# Patient Record
Sex: Male | Born: 1970 | Race: Black or African American | Hispanic: No | Marital: Married | Smoking: Never smoker
Health system: Southern US, Community
[De-identification: ages and names within clinical notes are randomized; demographics above are authoritative.]

## PROBLEM LIST (undated history)

## (undated) DIAGNOSIS — T7840XA Allergy, unspecified, initial encounter: Secondary | ICD-10-CM

## (undated) DIAGNOSIS — E785 Hyperlipidemia, unspecified: Secondary | ICD-10-CM

## (undated) DIAGNOSIS — I10 Essential (primary) hypertension: Secondary | ICD-10-CM

## (undated) HISTORY — DX: Essential (primary) hypertension: I10

## (undated) HISTORY — DX: Allergy, unspecified, initial encounter: T78.40XA

## (undated) HISTORY — DX: Hyperlipidemia, unspecified: E78.5

---

## 2003-05-24 HISTORY — PX: NASAL SINUS SURGERY: SHX719

## 2004-01-07 ENCOUNTER — Encounter: Admission: RE | Admit: 2004-01-07 | Discharge: 2004-01-07 | Payer: Self-pay | Admitting: Otolaryngology

## 2005-12-17 ENCOUNTER — Emergency Department (HOSPITAL_COMMUNITY): Admission: EM | Admit: 2005-12-17 | Discharge: 2005-12-17 | Payer: Self-pay | Admitting: Emergency Medicine

## 2011-09-13 ENCOUNTER — Ambulatory Visit (INDEPENDENT_AMBULATORY_CARE_PROVIDER_SITE_OTHER): Payer: BC Managed Care – PPO | Admitting: Internal Medicine

## 2011-09-13 VITALS — BP 138/88 | HR 84 | Temp 97.4°F | Resp 16 | Ht 67.5 in | Wt 175.8 lb

## 2011-09-13 DIAGNOSIS — J309 Allergic rhinitis, unspecified: Secondary | ICD-10-CM

## 2011-09-13 DIAGNOSIS — M542 Cervicalgia: Secondary | ICD-10-CM

## 2011-09-13 MED ORDER — FLUTICASONE PROPIONATE 50 MCG/ACT NA SUSP: 2.0000 | Freq: Every day | NASAL | Status: DC

## 2011-09-13 MED ORDER — PREDNISONE 20 MG PO TABS: ORAL_TABLET | ORAL | Status: DC

## 2011-09-13 NOTE — Patient Instructions (Signed)
Take the prednisone taper as prescribed.  To help control your allergy symptoms be sure and take Zyrtec 10 mg daily along with your Flonase nasal spray.  For your neck pain:  Use heat and gentle exercises twice daily.  Take ibuprophen or naproxen OTC as needed for 3 days for the pain.  Allergies, Generic Allergies may happen from anything your body is sensitive to. This may be food, medicines, pollens, chemicals, and nearly anything around you in everyday life that produces allergens. An allergen is anything that causes an allergy producing substance. Heredity is often a factor in causing these problems. This means you may have some of the same allergies as your parents. Food allergies happen in all age groups. Food allergies are some of the most severe and life threatening. Some common food allergies are cow's milk, seafood, eggs, nuts, wheat, and soybeans. SYMPTOMS   Swelling around the mouth.   An itchy red rash or hives.   Vomiting or diarrhea.   Difficulty breathing.  SEVERE ALLERGIC REACTIONS ARE LIFE-THREATENING. This reaction is called anaphylaxis. It can cause the mouth and throat to swell and cause difficulty with breathing and swallowing. In severe reactions only a trace amount of food (for example, peanut oil in a salad) may cause death within seconds. Seasonal allergies occur in all age groups. These are seasonal because they usually occur during the same season every year. They may be a reaction to molds, grass pollens, or tree pollens. Other causes of problems are house dust mite allergens, pet dander, and mold spores. The symptoms often consist of nasal congestion, a runny itchy nose associated with sneezing, and tearing itchy eyes. There is often an associated itching of the mouth and ears. The problems happen when you come in contact with pollens and other allergens. Allergens are the particles in the air that the body reacts to with an allergic reaction. This causes you to  release allergic antibodies. Through a chain of events, these eventually cause you to release histamine into the blood stream. Although it is meant to be protective to the body, it is this release that causes your discomfort. This is why you were given anti-histamines to feel better. If you are unable to pinpoint the offending allergen, it may be determined by skin or blood testing. Allergies cannot be cured but can be controlled with medicine. Hay fever is a collection of all or some of the seasonal allergy problems. It may often be treated with simple over-the-counter medicine such as diphenhydramine. Take medicine as directed. Do not drink alcohol or drive while taking this medicine. Check with your caregiver or package insert for child dosages. If these medicines are not effective, there are many new medicines your caregiver can prescribe. Stronger medicine such as nasal spray, eye drops, and corticosteroids may be used if the first things you try do not work well. Other treatments such as immunotherapy or desensitizing injections can be used if all else fails. Follow up with your caregiver if problems continue. These seasonal allergies are usually not life threatening. They are generally more of a nuisance that can often be handled using medicine. HOME CARE INSTRUCTIONS   If unsure what causes a reaction, keep a diary of foods eaten and symptoms that follow. Avoid foods that cause reactions.   If hives or rash are present:   Take medicine as directed.   You may use an over-the-counter antihistamine (diphenhydramine) for hives and itching as needed.   Apply cold compresses (cloths) to the  skin or take baths in cool water. Avoid hot baths or showers. Heat will make a rash and itching worse.   If you are severely allergic:   Following a treatment for a severe reaction, hospitalization is often required for closer follow-up.   Wear a medic-alert bracelet or necklace stating the allergy.   You  and your family must learn how to give adrenaline or use an anaphylaxis kit.   If you have had a severe reaction, always carry your anaphylaxis kit or EpiPen with you. Use this medicine as directed by your caregiver if a severe reaction is occurring. Failure to do so could have a fatal outcome.  SEEK MEDICAL CARE IF:  You suspect a food allergy. Symptoms generally happen within 30 minutes of eating a food.   Your symptoms have not gone away within 2 days or are getting worse.   You develop new symptoms.   You want to retest yourself or your child with a food or drink you think causes an allergic reaction. Never do this if an anaphylactic reaction to that food or drink has happened before. Only do this under the care of a caregiver.  SEEK IMMEDIATE MEDICAL CARE IF:   You have difficulty breathing, are wheezing, or have a tight feeling in your chest or throat.   You have a swollen mouth, or you have hives, swelling, or itching all over your body.   You have had a severe reaction that has responded to your anaphylaxis kit or an EpiPen. These reactions may return when the medicine has worn off. These reactions should be considered life threatening.  MAKE SURE YOU:   Understand these instructions.   Will watch your condition.   Will get help right away if you are not doing well or get worse.  Document Released: 08/02/2002 Document Revised: 04/28/2011 Document Reviewed: 01/07/2008 [facility] Patient Information 2012 [place], [place].
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
## 2011-10-20 ENCOUNTER — Encounter: Payer: Self-pay | Admitting: Family Medicine

## 2011-10-20 ENCOUNTER — Ambulatory Visit (INDEPENDENT_AMBULATORY_CARE_PROVIDER_SITE_OTHER): Payer: BC Managed Care – PPO | Admitting: Family Medicine

## 2011-10-20 VITALS — BP 142/95 | HR 67 | Temp 99.0°F | Resp 16 | Ht 67.5 in | Wt 168.0 lb

## 2011-10-20 DIAGNOSIS — Z130 Encounter for screening for diseases of the blood and blood-forming organs and certain disorders involving the immune mechanism: Secondary | ICD-10-CM

## 2011-10-20 DIAGNOSIS — Z0000 Encounter for general adult medical examination without abnormal findings: Secondary | ICD-10-CM

## 2011-10-20 DIAGNOSIS — Z13220 Encounter for screening for lipoid disorders: Secondary | ICD-10-CM

## 2011-10-20 DIAGNOSIS — Z23 Encounter for immunization: Secondary | ICD-10-CM

## 2011-10-20 DIAGNOSIS — B353 Tinea pedis: Secondary | ICD-10-CM | POA: Insufficient documentation

## 2011-10-20 LAB — POCT URINALYSIS DIPSTICK
Glucose, UA: NEGATIVE
Nitrite, UA: NEGATIVE
Spec Grav, UA: 1.025
Urobilinogen, UA: 0.2
pH, UA: 7

## 2011-10-20 MED ORDER — CLOTRIMAZOLE-BETAMETHASONE 1-0.05 % EX CREA: TOPICAL_CREAM | Freq: Two times a day (BID) | CUTANEOUS | Status: AC

## 2011-10-20 NOTE — Patient Instructions (Signed)
Testicular Problems and Self-Exam Men can examine themselves easily and effectively with positive results. Monthly exams detect problems early and save lives. There are numerous causes of swelling in the testicle. Testicular cancer usually appears as a firm painless lump in the front part of the testicle. This may feel like a dull ache or heavy feeling located in the lower abdomen (belly), groin, or scrotum.  The risk is greater in men with undescended testicles and it is more common in young men. It is responsible for almost a fifth of cancers in males between ages 22 and 54. Other common causes of swellings, lumps, and testicular pain include injuries, inflammation (soreness) from infection, hydrocele, and torsion. These are a few of the reasons to do monthly self-examination of the testicles. The exam only takes minutes and could add years to your life. Get in the habit! SELF-EXAMINATION OF THE TESTICLES The testicles are easiest to examine after warm baths or showers and are more difficult to examine when you are cold. This is because the muscles attached to the testicles retract and pull them up higher or into the abdomen. While standing, roll one testicle between the thumb and forefinger. Feel for lumps, swelling, or discomfort. A normal testicle is egg shaped and feels firm. It is smooth and not tender. The spermatic cord can be felt as a firm spaghetti-like cord at the back of the testicle. It is also important to examine your groins. This is the crease between the front of your leg and your abdomen. Also, feel for enlarged lymph nodes (glands). Enlarged nodes are also a cause for you to see your caregiver for evaluation.  Self-examination of the testicles and groin areas on a regular basis will help you to know what your own testicles and groins feel like. This will help you pick up an abnormality (difference) at an earlier stage. Early discovery is the key to curing this cancer or treating other  conditions. Any lump, change, or swelling in the testicle calls for immediate evaluation by your caregiver. Cancer of the testicle does not result in impotence and it does not prevent normal intercourse or prevent having children. If your caregiver feels that medical treatment or chemotherapy could lead to infertility, sperm can be frozen for future use. It is necessary to see a caregiver as soon as possible after the discovery of a lump in a testicle. Document Released: 08/15/2000 Document Revised: 04/28/2011 Document Reviewed: 05/10/2008 [employer] Patient Information 2012 ExitCare, Gov [facility]     Athlete's Foot Athlete's foot (tinea pedis) is a fungal infection of the skin on the feet. It often occurs on the skin between the toes or underneath the toes. It can also occur on the soles of the feet. Athlete's foot is more likely to occur in hot, humid weather. Not washing your feet or changing your socks often enough can contribute to athlete's foot. The infection can spread from person to person (contagious). CAUSES Athlete's foot is caused by a fungus. This fungus thrives in warm, moist places. Most people get athlete's foot by sharing shower stalls, towels, and wet floors with an infected person. People with weakened immune systems, including those with diabetes, may be more likely to get athlete's foot. SYMPTOMS   Itchy areas between the toes or on the soles of the feet.   White, flaky, or scaly areas between the toes or on the soles of the feet.   Tiny, intensely itchy blisters between the toes or on the soles of the feet.  Tiny cuts on the skin. These cuts can develop a bacterial infection.   Thick or discolored toenails.  DIAGNOSIS  Your caregiver can usually tell what the problem is by doing a physical exam. Your caregiver may also take a skin sample from the rash area. The skin sample may be examined under a microscope, or it may be tested to see if fungus will grow in the sample. A sample  may also be taken from your toenail for testing. TREATMENT  Over-the-counter and prescription medicines can be used to kill the fungus. These medicines are available as powders or creams. Your caregiver can suggest medicines for you. Fungal infections respond slowly to treatment. You may need to continue using your medicine for several weeks. PREVENTION   Do not share towels.   Wear sandals in wet areas, such as shared locker rooms and shared showers.   Keep your feet dry. Wear shoes that allow air to circulate. Wear cotton or wool socks.  HOME CARE INSTRUCTIONS   Take medicines as directed by your caregiver. Do not use steroid creams on athlete's foot.   Keep your feet clean and cool. Wash your feet daily and dry them thoroughly, especially between your toes.   Change your socks every day. Wear cotton or wool socks. In hot climates, you may need to change your socks 2 to 3 times per day.   Wear sandals or canvas tennis shoes with good air circulation.   If you have blisters, soak your feet in Burow's solution or Epsom salts for 20 to 30 minutes, 2 times a day to dry out the blisters. Make sure you dry your feet thoroughly afterward.  SEEK MEDICAL CARE IF:   You have a fever.   You have swelling, soreness, warmth, or redness in your foot.   You are not getting better after 7 days of treatment.   You are not completely cured after 30 days.   You have any problems caused by your medicines.  MAKE SURE YOU:   Understand these instructions.   Will watch your condition.   Will get help right away if you are not doing well or get worse.  Document Released: 05/06/2000 Document Revised: 04/28/2011 Document Reviewed: 02/25/2011 [facility] Patient Information 2012 [place], [place].Marland KitchenKeeping you healthy  Get these tests  Blood pressure- Have your blood pressure checked once a year by your healthcare provider.  Normal blood pressure is 120/80.  Weight- Have your body mass index (BMI)  calculated to screen for obesity.  BMI is a measure of body fat based on height and weight. You can also calculate your own BMI at https://www.west-esparza.com/.  Cholesterol- Have your cholesterol checked regularly starting at age 32, sooner may be necessary if you have diabetes, high blood pressure, if a family member developed heart diseases at an early age or if you smoke.   Chlamydia, HIV, and other sexual transmitted disease- Get screened each year until the age of 24 then within three months of each new sexual partner.  Diabetes- Have your blood sugar checked regularly if you have high blood pressure, high cholesterol, a family history of diabetes or if you are overweight.  Get these vaccines  Flu shot- Every fall.  Tetanus shot- Every 10 years.  Menactra- Single dose; prevents meningitis.  Take these steps  Don't smoke- If you do smoke, ask your healthcare provider about quitting. For tips on how to quit, go to www.smokefree.gov or call 1-800-QUIT-NOW.  Be physically active- Exercise 5 days a week for  at least 30 minutes.  If you are not already physically active start slow and gradually work up to 30 minutes of moderate physical activity.  Examples of moderate activity include walking briskly, mowing the yard, dancing, swimming bicycling, etc.  Eat a healthy diet- Eat a variety of healthy foods such as fruits, vegetables, low fat milk, low fat cheese, yogurt, lean meats, poultry, fish, beans, tofu, etc.  For more information on healthy eating, go to www.thenutritionsource.org  Drink alcohol in moderation- Limit alcohol intake two drinks or less a day.  Never drink and drive.  Dentist- Brush and floss teeth twice daily; visit your dentis twice a year.  Depression-Your emotional health is as important as your physical health.  If you're feeling down, losing interest in things you normally enjoy please talk with your healthcare provider.  Gun Safety- If you keep a gun in your home,  keep it unloaded and with the safety lock on.  Bullets should be stored separately.  Helmet use- Always wear a helmet when riding a motorcycle, bicycle, rollerblading or skateboarding.  Safe sex- If you may be exposed to a sexually transmitted infection, use a condom  Seat belts- Seat bels can save your life; always wear one.  Smoke/Carbon Monoxide detectors- These detectors need to be installed on the appropriate level of your home.  Replace batteries at least once a year.  Skin Cancer- When out in the sun, cover up and use sunscreen SPF 15 or higher.  Violence- If anyone is threatening or hurting you, please tell your healthcare provider.

## 2011-10-20 NOTE — Progress Notes (Signed)
Subjective:    Patient ID: Dakota Bowman, male    DOB: May 20, 1971, 41 y.o.   MRN: [id number]  HPI  This 41 y.o. [place] male is here for CPE. He has seasonal allergies which responded well  to Fluticasone and Cetirizine. 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 He is married (wife is pregnant) , does nt smoke nor consume alcohol. He exercises regularly.  Immunization status- pt received vaccines prior to coming to [place] but no immunizations in last   10 years except Flu vaccine annually.    Review of Systems  Constitutional: Negative.   HENT: Positive for sinus pressure. Negative for congestion, sore throat, rhinorrhea, sneezing and postnasal drip.   Eyes: Negative.   Respiratory: Negative for cough, chest tightness and shortness of breath.   Cardiovascular: Negative.   Gastrointestinal: Negative.   Genitourinary: Negative.   Musculoskeletal: Negative.   Skin: Negative.   Hematological: Negative.   Psychiatric/Behavioral: Negative.        Objective:   Physical Exam  Vitals reviewed. Constitutional: He is oriented to person, place, and time. He appears well-developed and well-nourished. No distress.  HENT:  Head: Normocephalic and atraumatic.  Right Ear: External ear normal.  Left Ear: External ear normal.  Nose: Nose normal.  Mouth/Throat: Oropharynx is clear and moist.  Eyes: EOM are normal. Pupils are equal, round, and reactive to light. No scleral icterus.       Conj injected  Neck: Normal range of motion. Neck supple. No thyromegaly present.  Cardiovascular: Normal rate, regular rhythm, normal heart sounds and intact distal pulses.  Exam reveals no gallop.   No murmur heard. Pulmonary/Chest: Effort normal and breath sounds normal. No respiratory distress. He has no wheezes.  Abdominal: Soft. Bowel sounds are normal. He exhibits no mass. There is no tenderness. There is no guarding.       No organomegaly   Genitourinary: Penis normal. Right testis shows no mass, no swelling and no tenderness. Right testis is descended. Left testis shows no mass, no swelling and no tenderness. Left testis is descended.  Musculoskeletal: Normal range of motion. He exhibits no edema and no tenderness.  Lymphadenopathy:    He has no cervical adenopathy.       Left: No inguinal adenopathy present.  Neurological: He is alert and oriented to person, place, and time. He has normal reflexes. No cranial nerve deficit. He exhibits normal muscle tone. Coordination normal.  Skin: Skin is warm and dry. Rash noted.       Feet- digits with scaling rash and macerated areas in web spaces (2nd-3rd digits)  Psychiatric: He has a normal mood and affect. His behavior is normal. Judgment and thought content normal.   Results for orders placed in visit on 10/20/11  POCT URINALYSIS DIPSTICK      Component Value Range   Color, UA yellow     Clarity, UA clear     Glucose, UA neg     Bilirubin, UA neg     Ketones, UA neg     Spec Grav, UA 1.025     Blood, UA neg     pH, UA 7.0     Protein, UA neg     Urobilinogen, UA 0.2     Nitrite, UA neg     Leukocytes, UA Negative            Assessment & Plan:   1. Routine general medical examination  at a health care facility  Comprehensive metabolic panel  2. Tinea pedis  RX: Clotrimazole- Betamethasone cream to be applied bid to clean dry skin. Keep skin dry (wear white cotton socks)  3. Screening, anemia, deficiency, iron  CBC with Differential  4. Screening for hyperlipidemia  Lipid panel  5. Need for prophylactic vaccination with combined diphtheria-tetanus-pertussis (DTP) vaccine  Tdap vaccine greater than or equal to 7yo IM

## 2011-10-21 LAB — CBC WITH DIFFERENTIAL/PLATELET
Basophils Absolute: 0 10*3/uL (ref 0.0–0.1)
Basophils Relative: 1 % (ref 0–1)
Eosinophils Absolute: 0.3 10*3/uL (ref 0.0–0.7)
HCT: 40 % (ref 39.0–52.0)
Lymphocytes Relative: 58 % — ABNORMAL HIGH (ref 12–46)
MCH: 26.1 pg (ref 26.0–34.0)
MCV: 76.6 fL — ABNORMAL LOW (ref 78.0–100.0)
Monocytes Absolute: 0.2 10*3/uL (ref 0.1–1.0)
Monocytes Relative: 6 % (ref 3–12)
Neutro Abs: 1.2 10*3/uL — ABNORMAL LOW (ref 1.7–7.7)
Neutrophils Relative %: 28 % — ABNORMAL LOW (ref 43–77)
Platelets: 215 10*3/uL (ref 150–400)
RBC: 5.22 MIL/uL (ref 4.22–5.81)
WBC: 4 10*3/uL (ref 4.0–10.5)

## 2011-10-21 LAB — LIPID PANEL
HDL: 53 mg/dL (ref >39)
VLDL: 14 mg/dL (ref 0–40)

## 2011-10-21 LAB — COMPREHENSIVE METABOLIC PANEL
Albumin: 4.2 g/dL (ref 3.5–5.2)
Alkaline Phosphatase: 50 U/L (ref 39–117)
BUN: 12 mg/dL (ref 6–23)
Calcium: 9.1 mg/dL (ref 8.4–10.5)
Chloride: 105 mEq/L (ref 96–112)

## 2011-10-26 ENCOUNTER — Other Ambulatory Visit: Payer: Self-pay | Admitting: Family Medicine

## 2011-10-26 DIAGNOSIS — R7989 Other specified abnormal findings of blood chemistry: Secondary | ICD-10-CM

## 2011-10-26 NOTE — Progress Notes (Signed)
Quick Note:  Please call pt and advise that the following labs are abnormal...  Blood counts are a little abnormal. Would like to recheck in 8 weeks; improve diet and eat as healthy as possible. Take a multivitamin for men- you can get this without a prescription. Lipids- total cholesterol and LDL('bad") cholesterol are elevated. Better nutrition can improve these numbers. ______

## 2011-10-26 NOTE — Progress Notes (Signed)
Copy sent

## 2011-11-01 ENCOUNTER — Telehealth: Payer: Self-pay

## 2011-11-01 NOTE — Telephone Encounter (Signed)
    Patient calling for lab results 

## 2011-11-02 NOTE — Telephone Encounter (Signed)
Pts questions answered.  

## 2012-09-03 ENCOUNTER — Ambulatory Visit (INDEPENDENT_AMBULATORY_CARE_PROVIDER_SITE_OTHER): Payer: BC Managed Care – PPO | Admitting: Family Medicine

## 2012-09-03 VITALS — BP 132/98 | HR 74 | Temp 97.0°F | Resp 16 | Ht 67.5 in | Wt 180.0 lb

## 2012-09-03 DIAGNOSIS — Z9109 Other allergy status, other than to drugs and biological substances: Secondary | ICD-10-CM

## 2012-09-03 MED ORDER — PREDNISONE 20 MG PO TABS: ORAL_TABLET | ORAL | Status: DC

## 2012-09-03 MED ORDER — FLUTICASONE PROPIONATE 50 MCG/ACT NA SUSP: 2.0000 | Freq: Every day | NASAL | Status: DC

## 2012-09-03 NOTE — Progress Notes (Signed)
42 year old man from [place] with his seasonal allergy flair.  He has been here before for the same problem. He's interested in doing something at presents these symptoms. He started had sinus surgery in the past which did not help. He's been taking Zyrtec without any improvement.  Objective: Marked swelling of the nasal mucosa with pallor. HEENT otherwise unremarkable Chest: Clear  Assessment: Severe seasonal allergies  Plan:Environmental allergies - Plan: predniSONE (DELTASONE) 20 MG tablet, fluticasone (FLONASE) 50 MCG/ACT nasal spray, Ambulatory referral to Allergy

## 2012-11-01 ENCOUNTER — Encounter: Payer: Self-pay | Admitting: Family Medicine

## 2012-11-01 ENCOUNTER — Ambulatory Visit (INDEPENDENT_AMBULATORY_CARE_PROVIDER_SITE_OTHER): Payer: BC Managed Care – PPO | Admitting: Family Medicine

## 2012-11-01 VITALS — BP 135/86 | HR 68 | Temp 98.2°F | Resp 16 | Ht 68.0 in | Wt 182.0 lb

## 2012-11-01 DIAGNOSIS — R21 Rash and other nonspecific skin eruption: Secondary | ICD-10-CM

## 2012-11-01 DIAGNOSIS — Z0000 Encounter for general adult medical examination without abnormal findings: Secondary | ICD-10-CM

## 2012-11-01 LAB — POCT URINALYSIS DIPSTICK
Bilirubin, UA: NEGATIVE
Blood, UA: NEGATIVE
Glucose, UA: NEGATIVE
Ketones, UA: NEGATIVE
Leukocytes, UA: NEGATIVE
Nitrite, UA: NEGATIVE
Protein, UA: NEGATIVE
Spec Grav, UA: 1.025
Urobilinogen, UA: 0.2
pH, UA: 6

## 2012-11-01 LAB — LIPID PANEL
Cholesterol: 213 mg/dL — ABNORMAL HIGH (ref 0–200)
HDL: 55 mg/dL (ref >39)
LDL Cholesterol: 141 mg/dL — ABNORMAL HIGH (ref 0–99)
Total CHOL/HDL Ratio: 3.9 Ratio
Triglycerides: 84 mg/dL (ref <150)
VLDL: 17 mg/dL (ref 0–40)

## 2012-11-01 LAB — CBC
HCT: 39.2 % (ref 39.0–52.0)
Hemoglobin: 13.6 g/dL (ref 13.0–17.0)
MCH: 26.5 pg (ref 26.0–34.0)
MCHC: 34.7 g/dL (ref 30.0–36.0)
MCV: 76.3 fL — ABNORMAL LOW (ref 78.0–100.0)
Platelets: 196 10*3/uL (ref 150–400)
RBC: 5.14 MIL/uL (ref 4.22–5.81)
RDW: 13.5 % (ref 11.5–15.5)
WBC: 4 10*3/uL (ref 4.0–10.5)

## 2012-11-01 LAB — COMPREHENSIVE METABOLIC PANEL
ALT: 25 U/L (ref 0–53)
AST: 25 U/L (ref 0–37)
Albumin: 4.1 g/dL (ref 3.5–5.2)
Alkaline Phosphatase: 56 U/L (ref 39–117)
BUN: 17 mg/dL (ref 6–23)
CO2: 25 mEq/L (ref 19–32)
Calcium: 9.4 mg/dL (ref 8.4–10.5)
Chloride: 104 mEq/L (ref 96–112)
Creat: 1.03 mg/dL (ref 0.50–1.35)
Glucose, Bld: 82 mg/dL (ref 70–99)
Potassium: 3.6 mEq/L (ref 3.5–5.3)
Sodium: 138 mEq/L (ref 135–145)
Total Bilirubin: 0.3 mg/dL (ref 0.3–1.2)
Total Protein: 6.9 g/dL (ref 6.0–8.3)

## 2012-11-01 MED ORDER — TRIAMCINOLONE ACETONIDE 0.1 % EX CREA: TOPICAL_CREAM | Freq: Two times a day (BID) | CUTANEOUS | Status: DC

## 2012-11-01 NOTE — Progress Notes (Signed)
Patient ID: Tawan Degroote MRN: [id number], DOB: [birth year] 42 y.o. Date of Encounter: 11/01/2012, 11:08 AM  Primary Physician: No primary provider on file.  Chief Complaint: Physical (CPE)  HPI: 43 y.o. y/o male from [place] with history noted below here for CPE.  Doing well.  Getting allergy shots near [place].  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  Review of Systems: Consitutional: No fever, chills, fatigue, night sweats, lymphadenopathy, or weight changes. Eyes: No visual changes, eye redness, or discharge. ENT/Mouth: Ears: No otalgia, tinnitus, hearing loss, discharge. Nose: No congestion, rhinorrhea, sinus pain, or epistaxis. Throat: No sore throat, post nasal drip, or teeth pain. Cardiovascular: No CP, palpitations, diaphoresis, DOE, edema, orthopnea, PND. Respiratory: No cough, hemoptysis, SOB, or wheezing. Gastrointestinal: No anorexia, dysphagia, reflux, pain, nausea, vomiting, hematemesis, diarrhea, constipation, BRBPR, or melena. Genitourinary: No dysuria, frequency, urgency, hematuria, incontinence, nocturia, decreased urinary stream, discharge, impotence, or testicular pain/masses. Musculoskeletal: No decreased ROM, myalgias, stiffness, joint swelling, or weakness. Skin: No rash, erythema, lesion changes, pain, warmth, jaundice, or pruritis. Neurological: No headache, dizziness, syncope, seizures, tremors, memory loss, coordination problems, or paresthesias. Psychological: No anxiety, depression, hallucinations, SI/HI. Endocrine: No fatigue, polydipsia, polyphagia, polyuria, or known diabetes. All other systems were reviewed and are otherwise negative.  Past Medical History  Diagnosis Date  . Allergy      Past Surgical History  Procedure Laterality Date  . Nasal sinus surgery  2005    Home Meds:  Prior to Admission medications   Medication Sig Start Date End Date Taking? Authorizing  Provider  cetirizine (ZYRTEC) 10 MG tablet Take 10 mg by mouth daily.   Yes Historical Provider, MD  fluticasone (FLONASE) 50 MCG/ACT nasal spray Place 2 sprays into the nose daily. 09/03/12 09/03/13 Yes Elvina Sidle, MD  predniSONE (DELTASONE) 20 MG tablet 2-2-2-2-2-1-1-1-1-1 avec nouriture 09/03/12   Elvina Sidle, MD    Allergies: No Known Allergies  History   Social History  . Marital Status: Single    Spouse Name: N/A    Number of Children: N/A  . Years of Education: N/A   Occupational History  . Not on file.   Social History Main Topics  . Smoking status: Never Smoker   . Smokeless tobacco: Not on file  . Alcohol Use: No  . Drug Use: No  . Sexually Active: Not on file   Other Topics Concern  . Not on file   Social History Narrative  . No narrative on file    History reviewed. No pertinent family history.  Physical Exam: Blood pressure 135/86, pulse 68, temperature 98.2 F (36.8 C), resp. rate 16, height 5\' 8"  (1.727 m), weight 182 lb (82.555 kg).  General: Well developed, well nourished, in no acute distress. HEENT: Normocephalic, atraumatic. Conjunctiva pink, sclera non-icteric. Pupils 2 mm constricting to 1 mm, round, regular, and equally reactive to light and accomodation. EOMI. Internal auditory canal clear. TMs with good cone of light and without pathology. Nasal mucosa pink. Nares are without discharge. No sinus tenderness. Oral mucosa pink. on  Receding gums . Pharynx without exudate.   Neck: Supple. Trachea midline. No thyromegaly. Full ROM. No lymphadenopathy. Lungs: Clear to auscultation bilaterally without wheezes, rales, or rhonchi. Breathing is of normal effort and unlabored. Cardiovascular: RRR with S1 S2. No murmurs, rubs, or gallops appreciated. Distal pulses 2+ symmetrically. No carotid or abdominal bruits Abdomen: Soft, non-tender, non-distended with normoactive bowel sounds. No hepatosplenomegaly  or masses. No rebound/guarding. No CVA tenderness.  Without hernias.  Genitourinary:  circumcised male. No penile lesions. Testes descended bilaterally, and smooth without tenderness.  Bag of worms palpation left scrotum c/w varicocele Musculoskeletal: Full range of motion and 5/5 strength throughout. Without swelling, atrophy, tenderness, crepitus, or warmth. Extremities without clubbing, cyanosis, or edema. Calves supple. Skin: Warm and moist without erythema, ecchymosis, wounds.  Eczematous changes on arms. Neuro: A+Ox3. CN II-XII grossly intact. Moves all extremities spontaneously. Full sensation throughout. Normal gait. DTR 2+ throughout upper and lower extremities. Finger to nose intact. Psych:  Responds to questions appropriately with a normal affect.   UA:   Assessment/Plan:  42 y.o. y/o  male here for CPE Recommended dentist, continued follow up with allergist Routine general medical examination at a health care facility - Plan: CBC, Comprehensive metabolic panel, Lipid panel, POCT urinalysis dipstick  Rash and nonspecific skin eruption - Plan: triamcinolone cream (KENALOG) 0.1 %   Signed, Elvina Sidle, MD 11/01/2012 11:08 AM

## 2012-11-01 NOTE — Patient Instructions (Signed)
Health Maintenance, Males A healthy lifestyle and preventative care can promote health and wellness.  Maintain regular health, dental, and eye exams.  Eat a healthy diet. Foods like vegetables, fruits, whole grains, low-fat dairy products, and lean protein foods contain the nutrients you need without too many calories. Decrease your intake of foods high in solid fats, added sugars, and salt. Get information about a proper diet from your caregiver, if necessary.  Regular physical exercise is one of the most important things you can do for your health. Most adults should get at least 150 minutes of moderate-intensity exercise (any activity that increases your heart rate and causes you to sweat) each week. In addition, most adults need muscle-strengthening exercises on 2 or more days a week.   Maintain a healthy weight. The body mass index (BMI) is a screening tool to identify possible weight problems. It provides an estimate of body fat based on height and weight. Your caregiver can help determine your BMI, and can help you achieve or maintain a healthy weight. For adults 20 years and older:  A BMI below 18.5 is considered underweight.  A BMI of 18.5 to 24.9 is normal.  A BMI of 25 to 29.9 is considered overweight.  A BMI of 30 and above is considered obese.  Maintain normal blood lipids and cholesterol by exercising and minimizing your intake of saturated fat. Eat a balanced diet with plenty of fruits and vegetables. Blood tests for lipids and cholesterol should begin at age 20 and be repeated every 5 years. If your lipid or cholesterol levels are high, you are over 50, or you are a high risk for heart disease, you may need your cholesterol levels checked more frequently.Ongoing high lipid and cholesterol levels should be treated with medicines, if diet and exercise are not effective.  If you smoke, find out from your caregiver how to quit. If you do not use tobacco, do not start.  If you  choose to drink alcohol, do not exceed 2 drinks per day. One drink is considered to be 12 ounces (355 mL) of beer, 5 ounces (148 mL) of wine, or 1.5 ounces (44 mL) of liquor.  Avoid use of street drugs. Do not share needles with anyone. Ask for help if you need support or instructions about stopping the use of drugs.  High blood pressure causes heart disease and increases the risk of stroke. Blood pressure should be checked at least every 1 to 2 years. Ongoing high blood pressure should be treated with medicines if weight loss and exercise are not effective.  If you are 45 to 43 years old, ask your caregiver if you should take aspirin to prevent heart disease.  Diabetes screening involves taking a blood sample to check your fasting blood sugar level. This should be done once every 3 years, after age 45, if you are within normal weight and without risk factors for diabetes. Testing should be considered at a younger age or be carried out more frequently if you are overweight and have at least 1 risk factor for diabetes.  Colorectal cancer can be detected and often prevented. Most routine colorectal cancer screening begins at the age of 50 and continues through age 75. However, your caregiver may recommend screening at an earlier age if you have risk factors for colon cancer. On a yearly basis, your caregiver may provide home test kits to check for hidden blood in the stool. Use of a small camera at the end of a tube,   to directly examine the colon (sigmoidoscopy or colonoscopy), can detect the earliest forms of colorectal cancer. Talk to your caregiver about this at age 64, when routine screening begins. Direct examination of the colon should be repeated every 5 to 10 years through age 28, unless early forms of pre-cancerous polyps or small growths are found.  Hepatitis C blood testing is recommended for all people born from [birth year] through 1966 and any individual with known risks for hepatitis C.  Healthy  men should no longer receive prostate-specific antigen (PSA) blood tests as part of routine cancer screening. Consult with your caregiver about prostate cancer screening.  Testicular cancer screening is not recommended for adolescents or adult males who have no symptoms. Screening includes self-exam, caregiver exam, and other screening tests. Consult with your caregiver about any symptoms you have or any concerns you have about testicular cancer.  Practice safe sex. Use condoms and avoid high-risk sexual practices to reduce the spread of sexually transmitted infections (STIs).  Use sunscreen with a sun protection factor (SPF) of 30 or greater. Apply sunscreen liberally and repeatedly throughout the day. You should seek shade when your shadow is shorter than you. Protect yourself by wearing long sleeves, pants, a wide-brimmed hat, and sunglasses year round, whenever you are outdoors.  Notify your caregiver of new moles or changes in moles, especially if there is a change in shape or color. Also notify your caregiver if a mole is larger than the size of a pencil eraser.  A one-time screening for abdominal aortic aneurysm (AAA) and surgical repair of large AAAs by sound wave imaging (ultrasonography) is recommended for ages 7 to 20 years who are current or former smokers.  Stay current with your immunizations. Document Released: 11/05/2007 Document Revised: 08/01/2011 Document Reviewed: 10/04/2010 [facility] Patient Information 2014 [place], [place]. Allergies, Generic Allergies may happen from anything your body is sensitive to. This may be food, medicines, pollens, chemicals, and nearly anything around you in everyday life that produces allergens. An allergen is anything that causes an allergy producing substance. Heredity is often a factor in causing these problems. This means you may have some of the same allergies as your parents. Food allergies happen in all age groups. Food allergies are some of  the most severe and life threatening. Some common food allergies are cow's milk, seafood, eggs, nuts, wheat, and soybeans. SYMPTOMS   Swelling around the mouth.  An itchy red rash or hives.  Vomiting or diarrhea.  Difficulty breathing. SEVERE ALLERGIC REACTIONS ARE LIFE-THREATENING. This reaction is called anaphylaxis. It can cause the mouth and throat to swell and cause difficulty with breathing and swallowing. In severe reactions only a trace amount of food (for example, peanut oil in a salad) may cause death within seconds. Seasonal allergies occur in all age groups. These are seasonal because they usually occur during the same season every year. They may be a reaction to molds, grass pollens, or tree pollens. Other causes of problems are house dust mite allergens, pet dander, and mold spores. The symptoms often consist of nasal congestion, a runny itchy nose associated with sneezing, and tearing itchy eyes. There is often an associated itching of the mouth and ears. The problems happen when you come in contact with pollens and other allergens. Allergens are the particles in the air that the body reacts to with an allergic reaction. This causes you to release allergic antibodies. Through a chain of events, these eventually cause you to release histamine into the blood stream.  Although it is meant to be protective to the body, it is this release that causes your discomfort. This is why you were given anti-histamines to feel better. If you are unable to pinpoint the offending allergen, it may be determined by skin or blood testing. Allergies cannot be cured but can be controlled with medicine. Hay fever is a collection of all or some of the seasonal allergy problems. It may often be treated with simple over-the-counter medicine such as diphenhydramine. Take medicine as directed. Do not drink alcohol or drive while taking this medicine. Check with your caregiver or package insert for child dosages. If  these medicines are not effective, there are many new medicines your caregiver can prescribe. Stronger medicine such as nasal spray, eye drops, and corticosteroids may be used if the first things you try do not work well. Other treatments such as immunotherapy or desensitizing injections can be used if all else fails. Follow up with your caregiver if problems continue. These seasonal allergies are usually not life threatening. They are generally more of a nuisance that can often be handled using medicine. HOME CARE INSTRUCTIONS   If unsure what causes a reaction, keep a diary of foods eaten and symptoms that follow. Avoid foods that cause reactions.  If hives or rash are present:  Take medicine as directed.  You may use an over-the-counter antihistamine (diphenhydramine) for hives and itching as needed.  Apply cold compresses (cloths) to the skin or take baths in cool water. Avoid hot baths or showers. Heat will make a rash and itching worse.  If you are severely allergic:  Following a treatment for a severe reaction, hospitalization is often required for closer follow-up.  Wear a medic-alert bracelet or necklace stating the allergy.  You and your family must learn how to give adrenaline or use an anaphylaxis kit.  If you have had a severe reaction, always carry your anaphylaxis kit or EpiPen with you. Use this medicine as directed by your caregiver if a severe reaction is occurring. Failure to do so could have a fatal outcome. SEEK MEDICAL CARE IF:  You suspect a food allergy. Symptoms generally happen within 30 minutes of eating a food.  Your symptoms have not gone away within 2 days or are getting worse.  You develop new symptoms.  You want to retest yourself or your child with a food or drink you think causes an allergic reaction. Never do this if an anaphylactic reaction to that food or drink has happened before. Only do this under the care of a caregiver. SEEK IMMEDIATE MEDICAL  CARE IF:   You have difficulty breathing, are wheezing, or have a tight feeling in your chest or throat.  You have a swollen mouth, or you have hives, swelling, or itching all over your body.  You have had a severe reaction that has responded to your anaphylaxis kit or an EpiPen. These reactions may return when the medicine has worn off. These reactions should be considered life threatening. MAKE SURE YOU:   Understand these instructions.  Will watch your condition.  Will get help right away if you are not doing well or get worse. Document Released: 08/02/2002 Document Revised: 08/01/2011 Document Reviewed: 01/07/2008 [facility] Patient Information 2014 [place], [place].

## 2012-11-02 LAB — HIV ANTIBODY (ROUTINE TESTING W REFLEX): HIV: NONREACTIVE

## 2014-01-23 ENCOUNTER — Encounter: Payer: Self-pay | Admitting: Family Medicine

## 2014-01-23 ENCOUNTER — Telehealth: Payer: Self-pay

## 2014-01-23 ENCOUNTER — Ambulatory Visit (INDEPENDENT_AMBULATORY_CARE_PROVIDER_SITE_OTHER): Payer: 59 | Admitting: Family Medicine

## 2014-01-23 VITALS — BP 139/94 | HR 66 | Temp 97.5°F | Resp 16 | Ht 67.5 in | Wt 177.6 lb

## 2014-01-23 DIAGNOSIS — R5381 Other malaise: Secondary | ICD-10-CM

## 2014-01-23 DIAGNOSIS — Z0000 Encounter for general adult medical examination without abnormal findings: Secondary | ICD-10-CM

## 2014-01-23 DIAGNOSIS — Z13220 Encounter for screening for lipoid disorders: Secondary | ICD-10-CM

## 2014-01-23 DIAGNOSIS — R5383 Other fatigue: Secondary | ICD-10-CM

## 2014-01-23 LAB — CBC WITH DIFFERENTIAL/PLATELET
Basophils Absolute: 0 10*3/uL (ref 0.0–0.1)
Basophils Relative: 1 % (ref 0–1)
Eosinophils Absolute: 0.4 10*3/uL (ref 0.0–0.7)
Eosinophils Relative: 11 % — ABNORMAL HIGH (ref 0–5)
HCT: 39.4 % (ref 39.0–52.0)
Hemoglobin: 13.2 g/dL (ref 13.0–17.0)
Lymphocytes Relative: 55 % — ABNORMAL HIGH (ref 12–46)
Lymphs Abs: 2 10*3/uL (ref 0.7–4.0)
MCH: 26.2 pg (ref 26.0–34.0)
MCHC: 33.5 g/dL (ref 30.0–36.0)
MCV: 78.3 fL (ref 78.0–100.0)
Monocytes Absolute: 0.2 10*3/uL (ref 0.1–1.0)
Monocytes Relative: 6 % (ref 3–12)
Neutro Abs: 1 10*3/uL — ABNORMAL LOW (ref 1.7–7.7)
Neutrophils Relative %: 27 % — ABNORMAL LOW (ref 43–77)
Platelets: 220 10*3/uL (ref 150–400)
RBC: 5.03 MIL/uL (ref 4.22–5.81)
RDW: 13.8 % (ref 11.5–15.5)
WBC: 3.6 10*3/uL — ABNORMAL LOW (ref 4.0–10.5)

## 2014-01-23 LAB — COMPLETE METABOLIC PANEL WITH GFR
ALT: 17 U/L (ref 0–53)
AST: 23 U/L (ref 0–37)
Albumin: 4.5 g/dL (ref 3.5–5.2)
Alkaline Phosphatase: 50 U/L (ref 39–117)
BUN: 19 mg/dL (ref 6–23)
CO2: 29 mEq/L (ref 19–32)
Calcium: 9.3 mg/dL (ref 8.4–10.5)
Chloride: 104 mEq/L (ref 96–112)
Creat: 1.04 mg/dL (ref 0.50–1.35)
GFR, Est African American: >89 mL/min
GFR, Est Non African American: 87 mL/min
Glucose, Bld: 88 mg/dL (ref 70–99)
Potassium: 3.9 mEq/L (ref 3.5–5.3)
Sodium: 139 mEq/L (ref 135–145)
Total Bilirubin: 0.4 mg/dL (ref 0.2–1.2)
Total Protein: 7.3 g/dL (ref 6.0–8.3)

## 2014-01-23 LAB — POCT URINALYSIS DIPSTICK
Bilirubin, UA: NEGATIVE
Blood, UA: NEGATIVE
Glucose, UA: NEGATIVE
Ketones, UA: NEGATIVE
Leukocytes, UA: NEGATIVE
Nitrite, UA: NEGATIVE
Protein, UA: NEGATIVE
Spec Grav, UA: 1.015
Urobilinogen, UA: 0.2
pH, UA: 7.5

## 2014-01-23 LAB — LIPID PANEL
Cholesterol: 224 mg/dL — ABNORMAL HIGH (ref 0–200)
HDL: 62 mg/dL (ref >39)
LDL Cholesterol: 148 mg/dL — ABNORMAL HIGH (ref 0–99)
Total CHOL/HDL Ratio: 3.6 Ratio
Triglycerides: 71 mg/dL (ref <150)
VLDL: 14 mg/dL (ref 0–40)

## 2014-01-23 NOTE — Patient Instructions (Signed)

## 2014-01-23 NOTE — Telephone Encounter (Signed)
PLEASE NOTE: Pt saw Dr Milus Glazier at 104 for CPE on 01/23/14 w/ biometric screening forms. Per Dr L sending originals to his box in lounge at 102 pending lab results to complete. Copy of forms at 104 in Amy L's box.   Once results received, please complete forms and call pt when ready.   bf

## 2014-01-23 NOTE — Progress Notes (Signed)
Patient ID: Roper Tolson MRN: [id number], DOB: [birth year] 43 y.o. Date of Encounter: 01/23/2014, 10:13 AM  Primary Physician: No primary provider on file.  Chief Complaint: Physical (CPE)  HPI: 43 y.o. y/o male with history noted below here for CPE.  Doing well. No issues/complaints.  Married.  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 Eyes: No visual changes, eye redness, or discharge. ENT/Mouth: Ears: No otalgia, tinnitus, hearing loss, discharge. Nose: No congestion, rhinorrhea, sinus pain, or epistaxis. Throat: No sore throat, post nasal drip, or teeth pain. Cardiovascular: No CP, palpitations, diaphoresis, DOE, edema, orthopnea, PND. Respiratory: No cough, hemoptysis, SOB, or wheezing. Gastrointestinal: No anorexia, dysphagia, reflux, pain, nausea, vomiting, hematemesis, diarrhea, constipation, BRBPR, or melena. Genitourinary: No dysuria, frequency, urgency, hematuria, incontinence, nocturia, decreased urinary stream, discharge, impotence, or testicular pain/masses. Musculoskeletal: No decreased ROM, myalgias, stiffness, joint swelling, or weakness. Skin: No rash, erythema, lesion changes, pain, warmth, jaundice, or pruritis. Neurological: No headache, dizziness, syncope, seizures, tremors, memory loss, coordination problems, or paresthesias. Psychological: No anxiety, depression, hallucinations, SI/HI. Endocrine: No fatigue, polydipsia, polyphagia, polyuria, or known diabetes. All other systems were reviewed and are otherwise negative.  Past Medical History  Diagnosis Date  . Allergy      Past Surgical History  Procedure Laterality Date  . Nasal sinus surgery  2005    Home Meds:  Prior to Admission medications   Medication Sig Start Date End Date Taking? Authorizing Provider   cetirizine (ZYRTEC) 10 MG tablet Take 10 mg by mouth daily.    Historical Provider, MD  fluticasone (FLONASE) 50 MCG/ACT nasal spray Place 2 sprays into the nose daily. 09/03/12 09/03/13  Elvina Sidle, MD  triamcinolone cream (KENALOG) 0.1 % Apply topically 2 (two) times daily. 11/01/12   Elvina Sidle, MD    Allergies: No Known Allergies  History   Social History  . Marital Status: Single    Spouse Name: N/A    Number of Children: N/A  . Years of Education: N/A   Occupational History  . Not on file.   Social History Main Topics  . Smoking status: Never Smoker   . Smokeless tobacco: Not on file  . Alcohol Use: No  . Drug Use: No  . Sexual Activity: Not on file   Other Topics Concern  . Not on file   Social History Narrative  . No narrative on file    History reviewed. No pertinent family history.  Physical Exam:  138/90 recheck BP.  Very athletic appearing man Blood pressure 139/94, pulse 66, temperature 97.5 F (36.4 C), temperature source Oral, resp. rate 16, height 5' 7.5" (1.715 m), weight 177 lb 9.6 oz (80.559 kg), SpO2 99.00%.  BP Readings from Last 3 Encounters:  01/23/14 139/94  11/01/12 135/86  09/03/12 132/98   General: Well developed, well nourished, in no acute distress. HEENT: Normocephalic, atraumatic. Conjunctiva pink, sclera non-icteric. Pupils 2 mm constricting to 1 mm, round, regular, and equally reactive to light and accomodation. EOMI. Internal auditory canal clear. TMs with good cone of light and without pathology. Nasal mucosa pink. Nares are without discharge. No sinus tenderness. Oral mucosa pink. Dentition excellent. Pharynx without exudate.     Visual Acuity  Right Eye Distance:   Left Eye Distance:   Bilateral Distance:    Right Eye Near:   Left Eye Near:  Bilateral Near:      Neck: Supple. Trachea midline. No thyromegaly. Full ROM. No lymphadenopathy. Lungs: Clear to auscultation bilaterally without wheezes, rales, or rhonchi.  Breathing is of normal effort and unlabored. Cardiovascular: RRR with S1 S2. No murmurs, rubs, or gallops appreciated. Distal pulses 2+ symmetrically. No carotid or abdominal bruits Abdomen: Soft, non-tender, non-distended with normoactive bowel sounds. No hepatosplenomegaly or masses. No rebound/guarding. No CVA tenderness. Without hernias.   Genitourinary:  circumcised male. No penile lesions. Testes descended bilaterally, and smooth without tenderness or masses.  Musculoskeletal: Full range of motion and 5/5 strength throughout. Without swelling, atrophy, tenderness, crepitus, or warmth. Extremities without clubbing, cyanosis, or edema. Calves supple. Skin: Warm and moist without erythema, ecchymosis, wounds, or rash. Neuro: A+Ox3. CN II-XII grossly intact. Moves all extremities spontaneously. Full sensation throughout. Normal gait. DTR 2+ throughout upper and lower extremities. Finger to nose intact. Psych:  Responds to questions appropriately with a normal affect.   Lab Results  Component Value Date   CHOL 213* 11/01/2012   CHOL 221* 10/20/2011   Lab Results  Component Value Date   HDL 55 11/01/2012   HDL 53 4/54/0981   Lab Results  Component Value Date   LDLCALC 141* 11/01/2012   LDLCALC 154* 10/20/2011   Lab Results  Component Value Date   TRIG 84 11/01/2012   TRIG 69 10/20/2011   Lab Results  Component Value Date   CHOLHDL 3.9 11/01/2012   CHOLHDL 4.2 10/20/2011   No results found for this basename: LDLDIRECT    Assessment/Plan:  43 y.o. y/o healthy male here for CPE Routine general medical examination at a health care facility  Other malaise and fatigue - Plan: CBC with Differential, COMPLETE METABOLIC PANEL WITH GFR, POCT urinalysis dipstick  Screening cholesterol level - Plan: Lipid panel  Signed, Elvina Sidle, MD     Signed, Elvina Sidle, MD 01/23/2014 10:13 AM

## 2014-01-24 NOTE — Telephone Encounter (Signed)
Pt form completed and pt notified. Unable to fax- pt has not filled out their part.

## 2014-10-01 ENCOUNTER — Ambulatory Visit (INDEPENDENT_AMBULATORY_CARE_PROVIDER_SITE_OTHER): Payer: 59 | Admitting: Physician Assistant

## 2014-10-01 VITALS — BP 150/90 | HR 76 | Temp 97.5°F | Resp 20 | Ht 67.5 in | Wt 183.1 lb

## 2014-10-01 DIAGNOSIS — R030 Elevated blood-pressure reading, without diagnosis of hypertension: Secondary | ICD-10-CM | POA: Diagnosis not present

## 2014-10-01 DIAGNOSIS — IMO0001 Reserved for inherently not codable concepts without codable children: Secondary | ICD-10-CM

## 2014-10-01 LAB — POCT URINALYSIS DIPSTICK
Bilirubin, UA: NEGATIVE
Blood, UA: NEGATIVE
GLUCOSE UA: NEGATIVE
Ketones, UA: NEGATIVE
LEUKOCYTES UA: NEGATIVE
NITRITE UA: NEGATIVE
Protein, UA: NEGATIVE
SPEC GRAV UA: 1.02
UROBILINOGEN UA: 0.2
pH, UA: 7

## 2014-10-01 NOTE — Patient Instructions (Addendum)
Start exercise 4 times per week. Check your blood pressure anytime, 2x per week.  If the blood pressure is over 140/90 more than once, I would like you to contact me and we will start you on an anti-hypertensive.    Hypertension Hypertension is another name for high blood pressure. High blood pressure forces your heart to work harder to pump blood. A blood pressure reading has two numbers, which includes a higher number over a lower number (example: 110/72). HOME CARE   Have your blood pressure rechecked by your doctor.  Only take medicine as told by your doctor. Follow the directions carefully. The medicine does not work as well if you skip doses. Skipping doses also puts you at risk for problems.  Do not smoke.  Monitor your blood pressure at home as told by your doctor. GET HELP IF:  You think you are having a reaction to the medicine you are taking.  You have repeat headaches or feel dizzy.  You have puffiness (swelling) in your ankles.  You have trouble with your vision. GET HELP RIGHT AWAY IF:   You get a very bad headache and are confused.  You feel weak, numb, or faint.  You get chest or belly (abdominal) pain.  You throw up (vomit).  You cannot breathe very well. MAKE SURE YOU:   Understand these instructions.  Will watch your condition.  Will get help right away if you are not doing well or get worse. Document Released: 10/26/2007 Document Revised: 05/14/2013 Document Reviewed: 03/01/2013 [facility] Patient Information 2015 [place], [place]. This information is not intended to replace advice given to you by your health care provider. Make sure you discuss any questions you have with your health care provider.

## 2014-10-01 NOTE — Progress Notes (Signed)
[facility] [address], [place] [id number] [phone]  Date:  10/01/2014   Name:  Dakota GarlandSaidou Bickle   DOB:  12/09/1970   MRN:  [id number]  PCP:  No primary care provider on file.    Chief Complaint: Hypertension   History of Present Illness:  Dakota Bowman is a 44 y.o. very pleasant male patient who presents with the following:  Minimal language barrier Patient is here today for elevated blood pressure, found at his dental visit this morning.  This was not rechecked manually.  He was advised for follow up and is here now for this concern.  He has never been told of increased blood pressure.  He has no chest pain, sob, dyspnea, diaphoresis, leg swelling, or vision change.  He has felt palpitations once every few months, which is not associated with dizziness or syncope.  This has occurred for years.  He was at the dentist for back tooth issues, but he currently denies teeth pain.    Patient Active Problem List   Diagnosis Date Noted  . Health care maintenance 10/20/2011  . Tinea pedis 10/20/2011    Past Medical History  Diagnosis Date  . Allergy     Past Surgical History  Procedure Laterality Date  . Nasal sinus surgery  2005    History  Substance Use Topics  . Smoking status: Never Smoker   . Smokeless tobacco: Never Used  . Alcohol Use: No    History reviewed. No pertinent family history.  No Known Allergies  Medication list has been reviewed and updated.  Current Outpatient Prescriptions on File Prior to Visit  Medication Sig Dispense Refill  . cetirizine (ZYRTEC) 10 MG tablet Take 10 mg by mouth daily.    Marland Kitchen. triamcinolone cream (KENALOG) 0.1 % Apply topically 2 (two) times daily. 454 g 1  . fluticasone (FLONASE) 50 MCG/ACT nasal spray Place 2 sprays into the nose daily. 16 g 6   No current facility-administered medications on file prior to visit.    Review of Systems: ROS otherwise unremarkable unless listed above.    Physical  Examination: Filed Vitals:   10/01/14 1040  BP: 118/80  Pulse: 76  Temp: 97.5 F (36.4 C)  Resp: 20   Filed Vitals:   10/01/14 1040  Height: 5' 7.5" (1.715 m)  Weight: 183 lb 2 oz (83.065 kg)   Body mass index is 28.24 kg/(m^2). Ideal Body Weight: Weight in (lb) to have BMI = 25: 161.7    Results for orders placed or performed in visit on 10/01/14  POCT urinalysis dipstick  Result Value Ref Range   Color, UA yellow    Clarity, UA clear    Glucose, UA neg    Bilirubin, UA neg    Ketones, UA neg    Spec Grav, UA 1.020    Blood, UA neg    pH, UA 7.0    Protein, UA neg    Urobilinogen, UA 0.2    Nitrite, UA neg    Leukocytes, UA Negative      Assessment and Plan: 44 year old females is here today for a chief complaint of elevated blood pressure. -Elevated BP seen at the dental office.  Upon check in, blood pressure was wnl.  Blood pressure elevated with my manual check.  I am suspicious that this may be presenting white coat. 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  Advised to monitor blood pressure 2x weekly.  If on two separate occasions,  blood pressure is over 140/90, will contact.  Otherwise will rtc in 4 weeks for BP recheck.    Elevated blood pressure - Plan: POCT urinalysis dipstick  Trena PlattStephanie Vann Okerlund, PA-C [facility] 5/13/201611:59 AM

## 2015-02-09 ENCOUNTER — Encounter: Payer: Self-pay | Admitting: Family Medicine

## 2015-02-09 ENCOUNTER — Ambulatory Visit (INDEPENDENT_AMBULATORY_CARE_PROVIDER_SITE_OTHER): Payer: 59 | Admitting: Family Medicine

## 2015-02-09 VITALS — BP 151/96 | HR 64 | Temp 98.4°F | Resp 16 | Ht 68.0 in | Wt 174.0 lb

## 2015-02-09 DIAGNOSIS — I10 Essential (primary) hypertension: Secondary | ICD-10-CM

## 2015-02-09 DIAGNOSIS — Z0000 Encounter for general adult medical examination without abnormal findings: Secondary | ICD-10-CM

## 2015-02-09 DIAGNOSIS — Z125 Encounter for screening for malignant neoplasm of prostate: Secondary | ICD-10-CM | POA: Diagnosis not present

## 2015-02-09 DIAGNOSIS — E785 Hyperlipidemia, unspecified: Secondary | ICD-10-CM

## 2015-02-09 DIAGNOSIS — M545 Low back pain, unspecified: Secondary | ICD-10-CM

## 2015-02-09 HISTORY — DX: Hyperlipidemia, unspecified: E78.5

## 2015-02-09 HISTORY — DX: Essential (primary) hypertension: I10

## 2015-02-09 LAB — TSH: TSH: 1.091 u[IU]/mL (ref 0.350–4.500)

## 2015-02-09 LAB — POCT URINALYSIS DIPSTICK
BILIRUBIN UA: NEGATIVE
GLUCOSE UA: NEGATIVE
Ketones, UA: NEGATIVE
Leukocytes, UA: NEGATIVE
NITRITE UA: NEGATIVE
PH UA: 7
Protein, UA: NEGATIVE
RBC UA: NEGATIVE
SPEC GRAV UA: 1.02
Urobilinogen, UA: 0.2

## 2015-02-09 LAB — COMPLETE METABOLIC PANEL WITH GFR
ALBUMIN: 4.3 g/dL (ref 3.6–5.1)
ALK PHOS: 48 U/L (ref 40–115)
ALT: 16 U/L (ref 9–46)
AST: 20 U/L (ref 10–40)
BILIRUBIN TOTAL: 0.4 mg/dL (ref 0.2–1.2)
BUN: 17 mg/dL (ref 7–25)
CO2: 30 mmol/L (ref 20–31)
Calcium: 9.3 mg/dL (ref 8.6–10.3)
Chloride: 105 mmol/L (ref 98–110)
Creat: 1.14 mg/dL (ref 0.60–1.35)
GFR, Est African American: 89 mL/min (ref >=60)
GFR, Est Non African American: 77 mL/min (ref >=60)
GLUCOSE: 89 mg/dL (ref 65–99)
POTASSIUM: 4.7 mmol/L (ref 3.5–5.3)
SODIUM: 141 mmol/L (ref 135–146)
TOTAL PROTEIN: 7.3 g/dL (ref 6.1–8.1)

## 2015-02-09 LAB — LIPID PANEL
CHOL/HDL RATIO: 3.5 ratio (ref <=5.0)
Cholesterol: 236 mg/dL — ABNORMAL HIGH (ref 125–200)
HDL: 68 mg/dL (ref >=40)
LDL CALC: 152 mg/dL — AB (ref <130)
Triglycerides: 80 mg/dL (ref <150)
VLDL: 16 mg/dL (ref <30)

## 2015-02-09 MED ORDER — HYDROCHLOROTHIAZIDE 12.5 MG PO CAPS: 12.5000 mg | ORAL_CAPSULE | Freq: Every day | ORAL | Status: DC

## 2015-02-09 NOTE — Patient Instructions (Addendum)
Start hydrochlorothiazide once per day for blood pressure. Keep a record of your blood pressures outside of the office and bring them to the next office visit. Return to the clinic or go to the nearest emergency room if any of your symptoms worsen or new symptoms occur.  For back pain - tylenol, heat or ice to affected area and gentle stretching. If not continuing to improve in next week or worse sooner - return for recheck.   You should receive a call or letter about your lab results within the next week to 10 days.   Hypertension Hypertension, commonly called high blood pressure, is when the force of blood pumping through your arteries is too strong. Your arteries are the blood vessels that carry blood from your heart throughout your body. A blood pressure reading consists of a higher number over a lower number, such as 110/72. The higher number (systolic) is the pressure inside your arteries when your heart pumps. The lower number (diastolic) is the pressure inside your arteries when your heart relaxes. Ideally you want your blood pressure below 120/80. Hypertension forces your heart to work harder to pump blood. Your arteries may become narrow or stiff. Having hypertension puts you at risk for heart disease, stroke, and other problems.  RISK FACTORS Some risk factors for high blood pressure are controllable. Others are not.  Risk factors you cannot control include:  1. Race. You may be at higher risk if you are African American. 2. Age. Risk increases with age. 3. Gender. Men are at higher risk than women before age 64 years. After age 21, women are at higher risk than men. Risk factors you can control include: 1. Not getting enough exercise or physical activity. 2. Being overweight. 3. Getting too much fat, sugar, calories, or salt in your diet. 4. Drinking too much alcohol. SIGNS AND SYMPTOMS Hypertension does not usually cause signs or symptoms. Extremely high blood pressure  (hypertensive crisis) may cause headache, anxiety, shortness of breath, and nosebleed. DIAGNOSIS  To check if you have hypertension, your health care provider will measure your blood pressure while you are seated, with your arm held at the level of your heart. It should be measured at least twice using the same arm. Certain conditions can cause a difference in blood pressure between your right and left arms. A blood pressure reading that is higher than normal on one occasion does not mean that you need treatment. If one blood pressure reading is high, ask your health care provider about having it checked again. TREATMENT  Treating high blood pressure includes making lifestyle changes and possibly taking medicine. Living a healthy lifestyle can help lower high blood pressure. You may need to change some of your habits. Lifestyle changes may include: 1. Following the DASH diet. This diet is high in fruits, vegetables, and whole grains. It is low in salt, red meat, and added sugars. 2. Getting at least 2 hours of brisk physical activity every week. 3. Losing weight if necessary. 4. Not smoking. 5. Limiting alcoholic beverages. 6. Learning ways to reduce stress. If lifestyle changes are not enough to get your blood pressure under control, your health care provider may prescribe medicine. You may need to take more than one. Work closely with your health care provider to understand the risks and benefits. HOME CARE INSTRUCTIONS  Have your blood pressure rechecked as directed by your health care provider.   Take medicines only as directed by your health care provider. Follow the directions  carefully. Blood pressure medicines must be taken as prescribed. The medicine does not work as well when you skip doses. Skipping doses also puts you at risk for problems.   Do not smoke.   Monitor your blood pressure at home as directed by your health care provider. SEEK MEDICAL CARE IF:   You think you are  having a reaction to medicines taken.  You have recurrent headaches or feel dizzy.  You have swelling in your ankles.  You have trouble with your vision. SEEK IMMEDIATE MEDICAL CARE IF:  You develop a severe headache or confusion.  You have unusual weakness, numbness, or feel faint.  You have severe chest or abdominal pain.  You vomit repeatedly.  You have trouble breathing. MAKE SURE YOU:   Understand these instructions.  Will watch your condition.  Will get help right away if you are not doing well or get worse. Document Released: 05/09/2005 Document Revised: 09/23/2013 Document Reviewed: 03/01/2013 [facility] Patient Information 2015 [place], [place]. This information is not intended to replace advice given to you by your health care provider. Make sure you discuss any questions you have with your health care provider.  Back Pain, Adult Low back pain is very common. About 1 in 5 people have back pain.The cause of low back pain is rarely dangerous. The pain often gets better over time.About half of people with a sudden onset of back pain feel better in just 2 weeks. About 8 in 10 people feel better by 6 weeks.  CAUSES Some common causes of back pain include: 4. Strain of the muscles or ligaments supporting the spine. 5. Wear and tear (degeneration) of the spinal discs. 6. Arthritis. 7. Direct injury to the back. DIAGNOSIS Most of the time, the direct cause of low back pain is not known.However, back pain can be treated effectively even when the exact cause of the pain is unknown.Answering your caregiver's questions about your overall health and symptoms is one of the most accurate ways to make sure the cause of your pain is not dangerous. If your caregiver needs more information, he or she may order lab work or imaging tests (X-rays or MRIs).However, even if imaging tests show changes in your back, this usually does not require surgery. HOME CARE INSTRUCTIONS For many  people, back pain returns.Since low back pain is rarely dangerous, it is often a condition that people can learn to [facility] their own.  5. Remain active. It is stressful on the back to sit or stand in one place. Do not sit, drive, or stand in one place for more than 30 minutes at a time. Take short walks on level surfaces as soon as pain allows.Try to increase the length of time you walk each day. 6. Do not stay in bed.Resting more than 1 or 2 days can delay your recovery. 7. Do not avoid exercise or work.Your body is made to move.It is not dangerous to be active, even though your back may hurt.Your back will likely heal faster if you return to being active before your pain is gone. 8. Pay attention to your body when you bend and lift. Many people have less discomfortwhen lifting if they bend their knees, keep the load close to their bodies,and avoid twisting. Often, the most comfortable positions are those that put less stress on your recovering back. 9. Find a comfortable position to sleep. Use a firm mattress and lie on your side with your knees slightly bent. If you lie on your back, put  a pillow under your knees. 10. Only take over-the-counter or prescription medicines as directed by your caregiver. Over-the-counter medicines to reduce pain and inflammation are often the most helpful.Your caregiver may prescribe muscle relaxant drugs.These medicines help dull your pain so you can more quickly return to your normal activities and healthy exercise. 11. Put ice on the injured area. 1. Put ice in a plastic bag. 2. Place a towel between your skin and the bag. 3. Leave the ice on for 15-20 minutes, 03-04 times a day for the first 2 to 3 days. After that, ice and heat may be alternated to reduce pain and spasms. 12. Ask your caregiver about trying back exercises and gentle massage. This may be of some benefit. 13. Avoid feeling anxious or stressed.Stress increases muscle tension and can  worsen back pain.It is important to recognize when you are anxious or stressed and learn ways to manage it.Exercise is a great option. SEEK MEDICAL CARE IF: 7. You have pain that is not relieved with rest or medicine. 8. You have pain that does not improve in 1 week. 9. You have new symptoms. 10. You are generally not feeling well. SEEK IMMEDIATE MEDICAL CARE IF:   You have pain that radiates from your back into your legs.  You develop new bowel or bladder control problems.  You have unusual weakness or numbness in your arms or legs.  You develop nausea or vomiting.  You develop abdominal pain.  You feel faint. Document Released: 05/09/2005 Document Revised: 11/08/2011 Document Reviewed: 09/10/2013 [facility] Patient Information 2015 [place], [place]. This information is not intended to replace advice given to you by your health care provider. Make sure you discuss any questions you have with your health care provider.   Keeping you healthy  Get these tests  Blood pressure- Have your blood pressure checked once a year by your healthcare provider.  Normal blood pressure is 120/80.  Weight- Have your body mass index (BMI) calculated to screen for obesity.  BMI is a measure of body fat based on height and weight. You can also calculate your own BMI at https://www.west-esparza.com/.  Cholesterol- Have your cholesterol checked regularly starting at age 57, sooner may be necessary if you have diabetes, high blood pressure, if a family member developed heart diseases at an early age or if you smoke.   Chlamydia, HIV, and other sexual transmitted disease- Get screened each year until the age of 35 then within three months of each new sexual partner.  Diabetes- Have your blood sugar checked regularly if you have high blood pressure, high cholesterol, a family history of diabetes or if you are overweight.  Get these vaccines  Flu shot- Every fall.  Tetanus shot- Every 10 years.  Menactra-  Single dose; prevents meningitis.  Take these steps  Don't smoke- If you do smoke, ask your healthcare provider about quitting. For tips on how to quit, go to www.smokefree.gov or call 1-800-QUIT-NOW.  Be physically active- Exercise 5 days a week for at least 30 minutes.  If you are not already physically active start slow and gradually work up to 30 minutes of moderate physical activity.  Examples of moderate activity include walking briskly, mowing the yard, dancing, swimming bicycling, etc.  Eat a healthy diet- Eat a variety of healthy foods such as fruits, vegetables, low fat milk, low fat cheese, yogurt, lean meats, poultry, fish, beans, tofu, etc.  For more information on healthy eating, go to www.thenutritionsource.org  Drink alcohol in moderation- Limit alcohol  intake two drinks or less a day.  Never drink and drive.  Dentist- Brush and floss teeth twice daily; visit your dentis twice a year.  Depression-Your emotional health is as important as your physical health.  If you're feeling down, losing interest in things you normally enjoy please talk with your healthcare provider.  Gun Safety- If you keep a gun in your home, keep it unloaded and with the safety lock on.  Bullets should be stored separately.  Helmet use- Always wear a helmet when riding a motorcycle, bicycle, rollerblading or skateboarding.  Safe sex- If you may be exposed to a sexually transmitted infection, use a condom  Seat belts- Seat bels can save your life; always wear one.  Smoke/Carbon Monoxide detectors- These detectors need to be installed on the appropriate level of your home.  Replace batteries at least once a year.  Skin Cancer- When out in the sun, cover up and use sunscreen SPF 15 or higher.  Violence- If anyone is threatening or hurting you, please tell your healthcare provider.

## 2015-02-09 NOTE — Progress Notes (Signed)
Subjective:  This chart was scribed for Meredith Staggers, MD by Stann Ore, Medical Scribe. This patient was seen in room 27 and the patient's care was started 9:40 AM.    Patient ID: Dakota Bowman, male    DOB: 02-04-1971, 44 y.o.   MRN: [id number]  HPI Dakota Bowman is a 44 y.o. male Here for physical exam.  Previously pt to Dr. Milus Glazier, h/o HTN, had elevated BP saw Judeth Cornfield on May 11th, 150/90 at that time, elevated at a dentist office, repeat BP 118/80, thought to be "white coat syndrome". Last physical was sept 2015.   Back Pain R lower back pain last few days. Just some soreness due to lifting. It's getting better. Denies urinary symptoms, incontinence, lower extremity symptoms.   Immunizations Immunization History  Administered Date(s) Administered  . Tdap 10/20/2011   Flu shot was recommended today  High BP 140-150s checked at home. His sister has high BP. Denies stroke or heart attacks in family.   Cancer Screening Agrees to prostate screening today No Fhx of cancer.   Eye He last saw a week ago.   Dentist He last saw a month ago.   Exercise Exercises once or twice a week.    STI testing He is married. Denies any new sexual activity. Declines STI testing.   HLD Lab Results  Component Value Date   CHOL 224* 01/23/2014   HDL 62 01/23/2014   LDLCALC 148* 01/23/2014   TRIG 71 01/23/2014   CHOLHDL 3.6 01/23/2014   Appears to have elevated lipids last year. Not on medications  allergic rhinitis Takes flonase and zyrtec for relief.    Patient Active Problem List   Diagnosis Date Noted  . Health care maintenance 10/20/2011  . Tinea pedis 10/20/2011   Past Medical History  Diagnosis Date  . Allergy    Past Surgical History  Procedure Laterality Date  . Nasal sinus surgery  2005   No Known Allergies Prior to Admission medications   Medication Sig Start Date End Date Taking? Authorizing Illyanna Petillo  cetirizine (ZYRTEC) 10 MG tablet Take 10  mg by mouth daily.    Historical Leola Fiore, MD  fluticasone (FLONASE) 50 MCG/ACT nasal spray Place 2 sprays into the nose daily. 09/03/12 09/03/13  Elvina Sidle, MD  triamcinolone cream (KENALOG) 0.1 % Apply topically 2 (two) times daily. 11/01/12   Elvina Sidle, MD   Social History   Social History  . Marital Status: Single    Spouse Name: N/A  . Number of Children: N/A  . Years of Education: N/A   Occupational History  . Not on file.   Social History Main Topics  . Smoking status: Never Smoker   . Smokeless tobacco: Never Used  . Alcohol Use: No  . Drug Use: No  . Sexual Activity: Yes   Other Topics Concern  . Not on file   Social History Narrative     Review of Systems 13 point ROS on point survey reviewed, no positive responses.     Objective:   Physical Exam  Constitutional: He is oriented to person, place, and time. He appears well-developed and well-nourished.  HENT:  Head: Normocephalic and atraumatic.  Eyes: EOM are normal. Pupils are equal, round, and reactive to light.  Neck: No JVD present. Carotid bruit is not present.  Cardiovascular: Normal rate, regular rhythm and normal heart sounds.   No murmur heard. Pulmonary/Chest: Effort normal and breath sounds normal. He has no rales.  Musculoskeletal: He exhibits no edema.  Full ROM lumbar spine, Minor tenderness on back,  no bony tenderness   Neurological: He is alert and oriented to person, place, and time.  Skin: Skin is warm and dry.  Psychiatric: He has a normal mood and affect.  Vitals reviewed.   Filed Vitals:   02/09/15 0848 02/09/15 0926 02/09/15 0927  BP: 156/98 154/91 151/96  Pulse: 76 63 64  Temp: 98.4 F (36.9 C)    Resp: 16    Height: 5\' 8"  (1.727 m)    Weight: 174 lb (78.926 kg)           Assessment & Plan:   By signing my name below, I, Stann Ore, attest that this documentation has been prepared under the direction and in the presence of Meredith Staggers,  MD. Electronically Signed: Stann Ore, Scribe. 02/09/2015 , 9:40 AM . Dakota Bowman is a 44 y.o. male Annual physical exam  - -anticipatory guidance as below in AVS, screening labs above. Health maintenance items as above in HPI discussed/recommended as applicable.   Essential hypertension - Plan: COMPLETE METABOLIC PANEL WITH GFR, TSH, hydrochlorothiazide (MICROZIDE) 12.5 MG capsule, POCT urinalysis dipstick  -With elevated readings outside of office, and now multiple elevated readings here, will start medication for hypertension. Start HCTZ 12.5 mg daily, side effects discussed in orthostatic precautions. CMP pending. Recheck in 6 weeks.  Right-sided low back pain without sciatica  -Improving, no red flags on exam or history. Continue symptomatic care, stretches, range of motion. RTC precautions if persistent or worsening  Hyperlipidemia - Plan: Lipid panel  -Lipid panel pending.  Screening for prostate cancer - Plan: PSA We discussed pros and cons of prostate cancer screening, and after this discussion, he chose to have screening done. PSA obtained, and no concerning findings on DRE.    Meds ordered this encounter  Medications  . hydrochlorothiazide (MICROZIDE) 12.5 MG capsule    Sig: Take 1 capsule (12.5 mg total) by mouth daily.    Dispense:  30 capsule    Refill:  1   Patient Instructions  Start hydrochlorothiazide once per day for blood pressure. Keep a record of your blood pressures outside of the office and bring them to the next office visit. Return to the clinic or go to the nearest emergency room if any of your symptoms worsen or new symptoms occur.  For back pain - tylenol, heat or ice to affected area and gentle stretching. If not continuing to improve in next week or worse sooner - return for recheck.   You should receive a call or letter about your lab results within the next week to 10 days.   Hypertension Hypertension, commonly called high blood pressure, is  when the force of blood pumping through your arteries is too strong. Your arteries are the blood vessels that carry blood from your heart throughout your body. A blood pressure reading consists of a higher number over a lower number, such as 110/72. The higher number (systolic) is the pressure inside your arteries when your heart pumps. The lower number (diastolic) is the pressure inside your arteries when your heart relaxes. Ideally you want your blood pressure below 120/80. Hypertension forces your heart to work harder to pump blood. Your arteries may become narrow or stiff. Having hypertension puts you at risk for heart disease, stroke, and other problems.  RISK FACTORS Some risk factors for high blood pressure are controllable. Others are not.  Risk factors you cannot control include:  1. Race. You may be at higher  risk if you are African American. 2. Age. Risk increases with age. 3. Gender. Men are at higher risk than women before age 52 years. After age 76, women are at higher risk than men. Risk factors you can control include: 1. Not getting enough exercise or physical activity. 2. Being overweight. 3. Getting too much fat, sugar, calories, or salt in your diet. 4. Drinking too much alcohol. SIGNS AND SYMPTOMS Hypertension does not usually cause signs or symptoms. Extremely high blood pressure (hypertensive crisis) may cause headache, anxiety, shortness of breath, and nosebleed. DIAGNOSIS  To check if you have hypertension, your health care Evelyne Makepeace will measure your blood pressure while you are seated, with your arm held at the level of your heart. It should be measured at least twice using the same arm. Certain conditions can cause a difference in blood pressure between your right and left arms. A blood pressure reading that is higher than normal on one occasion does not mean that you need treatment. If one blood pressure reading is high, ask your health care Marty Uy about having it  checked again. TREATMENT  Treating high blood pressure includes making lifestyle changes and possibly taking medicine. Living a healthy lifestyle can help lower high blood pressure. You may need to change some of your habits. Lifestyle changes may include: 1. Following the DASH diet. This diet is high in fruits, vegetables, and whole grains. It is low in salt, red meat, and added sugars. 2. Getting at least 2 hours of brisk physical activity every week. 3. Losing weight if necessary. 4. Not smoking. 5. Limiting alcoholic beverages. 6. Learning ways to reduce stress. If lifestyle changes are not enough to get your blood pressure under control, your health care Treavon Castilleja may prescribe medicine. You may need to take more than one. Work closely with your health care Mats Jeanlouis to understand the risks and benefits. HOME CARE INSTRUCTIONS  Have your blood pressure rechecked as directed by your health care Dhairya Corales.   Take medicines only as directed by your health care Billye Nydam. Follow the directions carefully. Blood pressure medicines must be taken as prescribed. The medicine does not work as well when you skip doses. Skipping doses also puts you at risk for problems.   Do not smoke.   Monitor your blood pressure at home as directed by your health care Jahmier Willadsen. SEEK MEDICAL CARE IF:   You think you are having a reaction to medicines taken.  You have recurrent headaches or feel dizzy.  You have swelling in your ankles.  You have trouble with your vision. SEEK IMMEDIATE MEDICAL CARE IF:  You develop a severe headache or confusion.  You have unusual weakness, numbness, or feel faint.  You have severe chest or abdominal pain.  You vomit repeatedly.  You have trouble breathing. MAKE SURE YOU:   Understand these instructions.  Will watch your condition.  Will get help right away if you are not doing well or get worse. Document Released: 05/09/2005 Document Revised: 09/23/2013  Document Reviewed: 03/01/2013 [facility] Patient Information 2015 [place], [place]. This information is not intended to replace advice given to you by your health care Zayquan Bogard. Make sure you discuss any questions you have with your health care Lasean Gorniak.  Back Pain, Adult Low back pain is very common. About 1 in 5 people have back pain.The cause of low back pain is rarely dangerous. The pain often gets better over time.About half of people with a sudden onset of back pain feel better in just 2  weeks. About 8 in 10 people feel better by 6 weeks.  CAUSES Some common causes of back pain include: 4. Strain of the muscles or ligaments supporting the spine. 5. Wear and tear (degeneration) of the spinal discs. 6. Arthritis. 7. Direct injury to the back. DIAGNOSIS Most of the time, the direct cause of low back pain is not known.However, back pain can be treated effectively even when the exact cause of the pain is unknown.Answering your caregiver's questions about your overall health and symptoms is one of the most accurate ways to make sure the cause of your pain is not dangerous. If your caregiver needs more information, he or she may order lab work or imaging tests (X-rays or MRIs).However, even if imaging tests show changes in your back, this usually does not require surgery. HOME CARE INSTRUCTIONS For many people, back pain returns.Since low back pain is rarely dangerous, it is often a condition that people can learn to [facility] their own.  5. Remain active. It is stressful on the back to sit or stand in one place. Do not sit, drive, or stand in one place for more than 30 minutes at a time. Take short walks on level surfaces as soon as pain allows.Try to increase the length of time you walk each day. 6. Do not stay in bed.Resting more than 1 or 2 days can delay your recovery. 7. Do not avoid exercise or work.Your body is made to move.It is not dangerous to be active, even though your back may  hurt.Your back will likely heal faster if you return to being active before your pain is gone. 8. Pay attention to your body when you bend and lift. Many people have less discomfortwhen lifting if they bend their knees, keep the load close to their bodies,and avoid twisting. Often, the most comfortable positions are those that put less stress on your recovering back. 9. Find a comfortable position to sleep. Use a firm mattress and lie on your side with your knees slightly bent. If you lie on your back, put a pillow under your knees. 10. Only take over-the-counter or prescription medicines as directed by your caregiver. Over-the-counter medicines to reduce pain and inflammation are often the most helpful.Your caregiver may prescribe muscle relaxant drugs.These medicines help dull your pain so you can more quickly return to your normal activities and healthy exercise. 11. Put ice on the injured area. 1. Put ice in a plastic bag. 2. Place a towel between your skin and the bag. 3. Leave the ice on for 15-20 minutes, 03-04 times a day for the first 2 to 3 days. After that, ice and heat may be alternated to reduce pain and spasms. 12. Ask your caregiver about trying back exercises and gentle massage. This may be of some benefit. 13. Avoid feeling anxious or stressed.Stress increases muscle tension and can worsen back pain.It is important to recognize when you are anxious or stressed and learn ways to manage it.Exercise is a great option. SEEK MEDICAL CARE IF: 7. You have pain that is not relieved with rest or medicine. 8. You have pain that does not improve in 1 week. 9. You have new symptoms. 10. You are generally not feeling well. SEEK IMMEDIATE MEDICAL CARE IF:   You have pain that radiates from your back into your legs.  You develop new bowel or bladder control problems.  You have unusual weakness or numbness in your arms or legs.  You develop nausea or vomiting.  You develop  abdominal pain.  You feel faint. Document Released: 05/09/2005 Document Revised: 11/08/2011 Document Reviewed: 09/10/2013 [facility] Patient Information 2015 [place], [place]. This information is not intended to replace advice given to you by your health care Alfonza Toft. Make sure you discuss any questions you have with your health care Anesha Hackert.   Keeping you healthy  Get these tests  Blood pressure- Have your blood pressure checked once a year by your healthcare Cambren Helm.  Normal blood pressure is 120/80.  Weight- Have your body mass index (BMI) calculated to screen for obesity.  BMI is a measure of body fat based on height and weight. You can also calculate your own BMI at https://www.west-esparza.com/.  Cholesterol- Have your cholesterol checked regularly starting at age 80, sooner may be necessary if you have diabetes, high blood pressure, if a family member developed heart diseases at an early age or if you smoke.   Chlamydia, HIV, and other sexual transmitted disease- Get screened each year until the age of 59 then within three months of each new sexual partner.  Diabetes- Have your blood sugar checked regularly if you have high blood pressure, high cholesterol, a family history of diabetes or if you are overweight.  Get these vaccines  Flu shot- Every fall.  Tetanus shot- Every 10 years.  Menactra- Single dose; prevents meningitis.  Take these steps  Don't smoke- If you do smoke, ask your healthcare Tomie Elko about quitting. For tips on how to quit, go to www.smokefree.gov or call 1-800-QUIT-NOW.  Be physically active- Exercise 5 days a week for at least 30 minutes.  If you are not already physically active start slow and gradually work up to 30 minutes of moderate physical activity.  Examples of moderate activity include walking briskly, mowing the yard, dancing, swimming bicycling, etc.  Eat a healthy diet- Eat a variety of healthy foods such as fruits, vegetables, low fat milk,  low fat cheese, yogurt, lean meats, poultry, fish, beans, tofu, etc.  For more information on healthy eating, go to www.thenutritionsource.org  Drink alcohol in moderation- Limit alcohol intake two drinks or less a day.  Never drink and drive.  Dentist- Brush and floss teeth twice daily; visit your dentis twice a year.  Depression-Your emotional health is as important as your physical health.  If you're feeling down, losing interest in things you normally enjoy please talk with your healthcare Elray Dains.  Gun Safety- If you keep a gun in your home, keep it unloaded and with the safety lock on.  Bullets should be stored separately.  Helmet use- Always wear a helmet when riding a motorcycle, bicycle, rollerblading or skateboarding.  Safe sex- If you may be exposed to a sexually transmitted infection, use a condom  Seat belts- Seat bels can save your life; always wear one.  Smoke/Carbon Monoxide detectors- These detectors need to be installed on the appropriate level of your home.  Replace batteries at least once a year.  Skin Cancer- When out in the sun, cover up and use sunscreen SPF 15 or higher.  Violence- If anyone is threatening or hurting you, please tell your healthcare Amijah Timothy.    I personally performed the services described in this documentation, which was scribed in my presence. The recorded information has been reviewed and considered, and addended by me as needed.

## 2015-02-10 ENCOUNTER — Telehealth: Payer: Self-pay | Admitting: Family Medicine

## 2015-02-10 LAB — PSA: PSA: 0.31 ng/mL (ref <=4.00)

## 2015-02-10 NOTE — Telephone Encounter (Signed)
Patient request for Dr. Neva Seat to complete Biometric Measures & Physical Confirmation form. I didn't have a chance to speak with the patient. The form was left at the front desk. Please call the patient at [phone] when form is complete. I will place the form at 102 in the nurse's box.

## 2015-02-11 NOTE — Telephone Encounter (Signed)
Done.  Placed in nurses box

## 2015-02-11 NOTE — Telephone Encounter (Signed)
In your box Dr. Neva Seat

## 2015-02-11 NOTE — Telephone Encounter (Signed)
Placed in pick up draw, called pt and left voicemail letting him know.

## 2015-03-23 ENCOUNTER — Telehealth: Payer: Self-pay | Admitting: *Deleted

## 2015-03-23 ENCOUNTER — Ambulatory Visit: Payer: 59 | Admitting: Family Medicine

## 2015-03-23 NOTE — Telephone Encounter (Signed)
Pt no show for appt., spoke with him, he stated "He had forgotten", appointment rescheduled.

## 2015-04-06 ENCOUNTER — Ambulatory Visit: Payer: Self-pay | Admitting: Family Medicine

## 2015-04-27 ENCOUNTER — Encounter: Payer: Self-pay | Admitting: Family Medicine

## 2015-04-27 ENCOUNTER — Ambulatory Visit (INDEPENDENT_AMBULATORY_CARE_PROVIDER_SITE_OTHER): Payer: 59 | Admitting: Family Medicine

## 2015-04-27 VITALS — BP 140/100 | HR 67 | Temp 98.5°F | Resp 16 | Ht 68.0 in | Wt 177.2 lb

## 2015-04-27 DIAGNOSIS — R519 Headache, unspecified: Secondary | ICD-10-CM

## 2015-04-27 DIAGNOSIS — R51 Headache: Secondary | ICD-10-CM | POA: Diagnosis not present

## 2015-04-27 DIAGNOSIS — E785 Hyperlipidemia, unspecified: Secondary | ICD-10-CM

## 2015-04-27 DIAGNOSIS — I10 Essential (primary) hypertension: Secondary | ICD-10-CM | POA: Diagnosis not present

## 2015-04-27 MED ORDER — HYDROCHLOROTHIAZIDE 12.5 MG PO TABS: 12.5000 mg | ORAL_TABLET | Freq: Every day | ORAL | Status: DC

## 2015-04-27 NOTE — Patient Instructions (Signed)
Keep a record of your blood pressures outside of the office and bring them to the next office visit. If headaches continue once blood pressure controlled - return to discuss further. Return to the clinic or go to the nearest emergency room if any of your symptoms worsen or new symptoms occur.  Work on walking or other form of exercise - minimum 150 minutes per week.   recheck with blood work  In next 3 months to decide if cholesterol medicine needed.

## 2015-04-27 NOTE — Progress Notes (Signed)
Subjective:    Patient ID: Dakota Bowman, male    DOB: 07/01/1970, 44 y.o.   MRN: [id number] By signing my name below, I, Littie Deedsichard Sun, attest that this documentation has been prepared under the direction and in the presence of Meredith StaggersJeffrey Sanvi Ehler, MD.  Electronically Signed: Littie Deedsichard Sun, Medical Scribe. 04/27/2015. 10:51 AM.  HPI HPI Comments: Dakota Bowman is a 44 y.o. male with a history of hypertension and hyperlipidemia who presents to the [facility] for a follow-up.   Hypertension: Last seen by me in September for a physical. At that time, his BP was 151/96. Initially thought to be white coat syndrome, but with multiple elevated readings, he was diagnosed with hypertension at that visit. Started on HCTZ 12.5 mg qd. He has been out of medications for 1 month because he had forgotten and missed his last appointment 1 month ago. Patient states he had been compliant with his medications. [facility] and was obtaining readings around 140s/90s. He has been cutting down on salt, but has not been exercising regularly. He sometimes walks for exercise. Patient does report having an intermittent, mild headache that started this morning. He states he did not sleep well just last night. He states he normally does not have any difficulty sleeping. Patient denies snoring, lightheadedness, dizziness, blurred vision, and weakness. Results for orders placed or performed in visit on 02/09/15  COMPLETE METABOLIC PANEL WITH GFR  Result Value Ref Range   Sodium 141 135 - 146 mmol/L   Potassium 4.7 3.5 - 5.3 mmol/L   Chloride 105 98 - 110 mmol/L   CO2 30 20 - 31 mmol/L   Glucose, Bld 89 65 - 99 mg/dL   BUN 17 7 - 25 mg/dL   Creat 4.091.14 8.110.60 - 9.141.35 mg/dL   Total Bilirubin 0.4 0.2 - 1.2 mg/dL   Alkaline Phosphatase 48 40 - 115 U/L   AST 20 10 - 40 U/L   ALT 16 9 - 46 U/L   Total Protein 7.3 6.1 - 8.1 g/dL   Albumin 4.3 3.6 - 5.1 g/dL     Calcium 9.3 8.6 - 78.210.3 mg/dL   GFR, Est African American 89 >=60 mL/min   GFR, Est Non African American 77 >=60 mL/min  Lipid panel  Result Value Ref Range   Cholesterol 236 (H) 125 - 200 mg/dL   Triglycerides 80 <956<150 mg/dL   HDL 68 >=21>=40 mg/dL   Total CHOL/HDL Ratio 3.5 <=5.0 Ratio   VLDL 16 <30 mg/dL   LDL Cholesterol 308152 (H) <130 mg/dL  TSH  Result Value Ref Range   TSH 1.091 0.350 - 4.500 uIU/mL  PSA  Result Value Ref Range   PSA 0.31 <=4.00 ng/mL  POCT urinalysis dipstick  Result Value Ref Range   Color, UA yellow    Clarity, UA clear    Glucose, UA neg    Bilirubin, UA neg    Ketones, UA neg    Spec Grav, UA 1.020    Blood, UA neg    pH, UA 7.0    Protein, UA neg    Urobilinogen, UA 0.2    Nitrite, UA neg    Leukocytes, UA Negative Negative    Hyperlipidemia: Recommended diet and exercise as initial treatment.    Patient Active Problem List   Diagnosis Date Noted  . Essential hypertension 02/09/2015  . Hyperlipidemia 02/09/2015  . Health care maintenance 10/20/2011  . Tinea pedis  10/20/2011   Past Medical History  Diagnosis Date  . Allergy   . Essential hypertension 02/09/2015  . Hyperlipidemia 02/09/2015   Past Surgical History  Procedure Laterality Date  . Nasal sinus surgery  2005   No Known Allergies Prior to Admission medications   Medication Sig Start Date End Date Taking? Authorizing Provider  cetirizine (ZYRTEC) 10 MG tablet Take 10 mg by mouth daily.   Yes Historical Provider, MD  hydrochlorothiazide (MICROZIDE) 12.5 MG capsule Take 1 capsule (12.5 mg total) by mouth daily. 02/09/15  Yes Shade Flood, MD  triamcinolone cream (KENALOG) 0.1 % Apply topically 2 (two) times daily. 11/01/12  Yes Elvina Sidle, MD  fluticasone [facility]) 50 MCG/ACT nasal spray Place 2 sprays into the nose daily. 09/03/12 02/09/15  Elvina Sidle, MD   Social History   Social History  . Marital Status: Single    Spouse Name: N/A  . Number of Children: N/A  .  Years of Education: N/A   Occupational History  . Not on file.   Social History Main Topics  . Smoking status: Never Smoker   . Smokeless tobacco: Never Used  . Alcohol Use: No  . Drug Use: No  . Sexual Activity: Yes   Other Topics Concern  . Not on file   Social History Narrative     Review of Systems  Constitutional: Negative for fatigue and unexpected weight change.  Eyes: Negative for visual disturbance.  Respiratory: Negative for cough, chest tightness and shortness of breath.   Cardiovascular: Negative for chest pain, palpitations and leg swelling.  Gastrointestinal: Negative for abdominal pain and blood in stool.  Neurological: Positive for headaches. Negative for dizziness, weakness and light-headedness.       Objective:   Physical Exam  Constitutional: He is oriented to person, place, and time. He appears well-developed and well-nourished.  HENT:  Head: Normocephalic and atraumatic.  Eyes: EOM are normal. Pupils are equal, round, and reactive to light. Right eye exhibits no nystagmus. Left eye exhibits no nystagmus.  Neck: No JVD present. Carotid bruit is not present.  Cardiovascular: Normal rate, regular rhythm and normal heart sounds.   No murmur heard. Pulmonary/Chest: Effort normal and breath sounds normal. He has no rales.  Musculoskeletal: He exhibits no edema.  Neurological: He is alert and oriented to person, place, and time. He displays a negative Romberg sign.  Normal heel-to-toe. Normal finger-to-nose. No pronator drift.  Skin: Skin is warm and dry.  Psychiatric: He has a normal mood and affect.  Vitals reviewed.     Filed Vitals:   04/27/15 1028 04/27/15 1032  BP: 140/102 140/100  Pulse: 67 67  Temp: 98.5 F (36.9 C) 98.5 F (36.9 C)  TempSrc: Oral Oral  Resp: 16 16  Height:  (1.727 m)  (1.727 m)  Weight: 177 lb 3.2 oz (80.377 kg) 177 lb 3.2 oz (80.377 kg)  SpO2: 98% 98%       Assessment & Plan:   Dakota Bowman is a 44  y.o. male Essential hypertension - Plan: hydrochlorothiazide (HYDRODIURIL) 12.5 MG tablet  - elevated off meds. Restart HCTZ at 12.5mg /d and recheck in next 2-3 months - sooner if persistently elevated out of office on meds.   Hyperlipidemia  -work on diet, exercise/physical activity and recheck on fasting labs at next ov.   Nonintractable headache, unspecified chronicity pattern, unspecified headache type  -nonfocal neuro exam. Likely due in part to elevated BP. rtc if persistent once BP controlled.   Meds  ordered this encounter  Medications  . hydrochlorothiazide (HYDRODIURIL) 12.5 MG tablet    Sig: Take 1 tablet (12.5 mg total) by mouth daily.    Dispense:  90 tablet    Refill:  0    Tablet only (no gel/capsule)   Patient Instructions  Keep a record of your blood pressures outside of the office and bring them to the next office visit. If headaches continue once blood pressure controlled - return to discuss further. Return to the clinic or go to the nearest emergency room if any of your symptoms worsen or new symptoms occur.  Work on walking or other form of exercise - minimum 150 minutes per week.   recheck with blood work  In next 3 months to decide if cholesterol medicine needed.      I personally performed the services described in this documentation, which was scribed in my presence. The recorded information has been reviewed and considered, and addended by me as needed.

## 2015-07-27 ENCOUNTER — Ambulatory Visit: Payer: 59 | Admitting: Family Medicine

## 2015-07-29 ENCOUNTER — Encounter: Payer: Self-pay | Admitting: Family Medicine

## 2015-07-29 ENCOUNTER — Ambulatory Visit (INDEPENDENT_AMBULATORY_CARE_PROVIDER_SITE_OTHER): Payer: 59 | Admitting: Family Medicine

## 2015-07-29 VITALS — BP 134/102 | HR 72 | Temp 97.8°F | Resp 16 | Ht 67.75 in | Wt 187.2 lb

## 2015-07-29 DIAGNOSIS — E785 Hyperlipidemia, unspecified: Secondary | ICD-10-CM

## 2015-07-29 DIAGNOSIS — I10 Essential (primary) hypertension: Secondary | ICD-10-CM | POA: Diagnosis not present

## 2015-07-29 LAB — LIPID PANEL
Cholesterol: 211 mg/dL — ABNORMAL HIGH (ref 125–200)
HDL: 55 mg/dL (ref >=40)
LDL CALC: 134 mg/dL — AB (ref <130)
TRIGLYCERIDES: 112 mg/dL (ref <150)
Total CHOL/HDL Ratio: 3.8 Ratio (ref <=5.0)
VLDL: 22 mg/dL (ref <30)

## 2015-07-29 LAB — COMPLETE METABOLIC PANEL WITH GFR
ALT: 23 U/L (ref 9–46)
AST: 24 U/L (ref 10–40)
Albumin: 3.8 g/dL (ref 3.6–5.1)
Alkaline Phosphatase: 48 U/L (ref 40–115)
BUN: 12 mg/dL (ref 7–25)
CHLORIDE: 106 mmol/L (ref 98–110)
CO2: 25 mmol/L (ref 20–31)
CREATININE: 1.07 mg/dL (ref 0.60–1.35)
Calcium: 8.9 mg/dL (ref 8.6–10.3)
GFR, EST NON AFRICAN AMERICAN: 83 mL/min (ref >=60)
GLUCOSE: 78 mg/dL (ref 65–99)
Potassium: 3.8 mmol/L (ref 3.5–5.3)
SODIUM: 139 mmol/L (ref 135–146)
Total Bilirubin: 0.3 mg/dL (ref 0.2–1.2)
Total Protein: 6.6 g/dL (ref 6.1–8.1)

## 2015-07-29 MED ORDER — HYDROCHLOROTHIAZIDE 25 MG PO TABS: 25.0000 mg | ORAL_TABLET | Freq: Every day | ORAL | Status: DC

## 2015-07-29 NOTE — Progress Notes (Signed)
Subjective:    Patient ID: Dakota Bowman, male    DOB: 02/10/1971, 45 y.o.   MRN: [id number] By signing my name below, I, Dakota Bowman, attest that this documentation has been prepared under the direction and in the presence of Dakota StaggersJeffrey Lexus Barletta, MD.  Electronically Signed: Littie Deedsichard Bowman, Medical Scribe. 07/29/2015. 10:34 AM.  HPI HPI Comments: Dakota Bowman is a 45 y.o. male with a history of hypertension and hyperlipidemia who presents to the [facility] for a 3 month follow-up. Patient is fasting today; he last ate 6.5 hours ago.  Hypertension: Suspected white coat syndrome, but multiple outside elevated readings. Started on HCTZ September 2016. He had been off medications last visit with some elevated readings. Restarted HCTZ 12.5 mg qd. Patient states he has been compliant with the HCTZ. Home readings have ranged 128/92 up to 164/104, most recent readings have been elevated around 149/101. 13 outside readings, only one reading below 90 diastolic. His headaches have improved. Patient denies any side effects with his medications. He denies chest pain and SOB. Lab Results  Component Value Date   CREATININE 1.14 02/09/2015    Hyperlipidemia: Recommended diet and exercise, and re-check today. Patient states he has been exercising for 30 minutes, 2-3 times a week. 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  Patient Active Problem List   Diagnosis Date Noted  . Essential hypertension 02/09/2015  . Hyperlipidemia 02/09/2015  . Health care  maintenance 10/20/2011  . Tinea pedis 10/20/2011   Past Medical History  Diagnosis Date  . Allergy   . Essential hypertension 02/09/2015  . Hyperlipidemia 02/09/2015   Past Surgical History  Procedure Laterality Date  . Nasal sinus surgery  2005   No Known Allergies Prior to Admission medications   Medication Sig Start Date End Date Taking? Authorizing Provider  cetirizine (ZYRTEC) 10 MG tablet Take 10 mg by mouth daily.    Historical Provider, MD  fluticasone (FLONASE) 50 MCG/ACT nasal spray Place 2 sprays into the nose daily. 09/03/12 02/09/15  Dakota SidleKurt Lauenstein, MD  hydrochlorothiazide (HYDRODIURIL) 12.5 MG tablet Take 1 tablet (12.5 mg total) by mouth daily. 04/27/15   Dakota FloodJeffrey R Robie Oats, MD  triamcinolone cream (KENALOG) 0.1 % Apply topically 2 (two) times daily. 11/01/12   Dakota SidleKurt Lauenstein, MD   Social History   Social History  . Marital Status: Single    Spouse Name: N/A  . Number of Children: N/A  . Years of Education: N/A   Occupational History  . Not on file.   Social History Main Topics  . Smoking status: Never Smoker   . Smokeless tobacco: Never Used  . Alcohol Use: No  . Drug Use: No  . Sexual Activity: Yes   Other Topics Concern  . Not on file   Social History Narrative     Review of Systems  Constitutional: Negative for fatigue and unexpected weight change.  Eyes: Negative for visual disturbance.  Respiratory: Negative for cough, chest tightness and shortness of breath.   Cardiovascular: Negative for chest pain, palpitations and leg swelling.  Gastrointestinal: Negative for abdominal pain and blood in stool.  Neurological: Negative for dizziness, light-headedness and headaches.       Objective:   Physical Exam  Constitutional: He is oriented to person, place, and time. He appears well-developed and well-nourished.  HENT:  Head: Normocephalic and atraumatic.  Eyes: EOM are normal. Pupils are equal, round, and reactive to light.  Neck: No JVD present.  Carotid bruit is not present.  Cardiovascular: Normal rate, regular rhythm and normal heart sounds.   No murmur heard. Pulmonary/Chest: Effort normal and breath sounds normal. He has no rales.  Musculoskeletal: He exhibits no edema.  Neurological: He is alert and oriented to person, place, and time.  Skin: Skin is warm and dry.  Psychiatric: He has a normal mood and affect.  Vitals reviewed.   Filed Vitals:   07/29/15 1023 07/29/15 1027  BP: 130/100 134/102  Pulse: 72   Temp: 97.8 F (36.6 C)   TempSrc: Oral   Resp: 16   Height: 5' 7.75" (1.721 m)   Weight: 187 lb 3.2 oz (84.913 kg)   SpO2: 98%          Assessment & Plan:   Dakota Bowman is a 45 y.o. male Essential hypertension - Plan: COMPLETE METABOLIC PANEL WITH GFR, hydrochlorothiazide (HYDRODIURIL) 25 MG tablet  - still decreased control - will increase HCTZ to  qd. Orthostatic precautions.   Hyperlipidemia - Plan: COMPLETE METABOLIC PANEL WITH GFR, Lipid panel  - repeat lipids. Likely will need statin, but can look at ASCVD risk to determine.   Meds ordered this encounter  Medications  . hydrochlorothiazide (HYDRODIURIL) 25 MG tablet    Sig: Take 1 tablet (25 mg total) by mouth daily.    Dispense:  90 tablet    Refill:  1    Tablet only (no gel/capsule)   Patient Instructions  You should receive a call or letter about your lab results within the next week to 10 days. If cholesterol elevated again, you may need to start a medication, but we will call you about this.   I called in higher dose of blood pressure medicine.  If any lightheadedness or dizziness with this dose - return to discuss changes.   Recheck in 3 months for blood pressure check.  Return to the clinic or go to the nearest emergency room if any of your symptoms worsen or new symptoms occur.     I personally performed the services described in this documentation, which was scribed in my presence. The recorded information has been reviewed  and considered, and addended by me as needed.

## 2015-07-29 NOTE — Patient Instructions (Signed)
You should receive a call or letter about your lab results within the next week to 10 days. If cholesterol elevated again, you may need to start a medication, but we will call you about this.   I called in higher dose of blood pressure medicine.  If any lightheadedness or dizziness with this dose - return to discuss changes.   Recheck in 3 months for blood pressure check.  Return to the clinic or go to the nearest emergency room if any of your symptoms worsen or new symptoms occur.

## 2015-08-21 ENCOUNTER — Encounter: Payer: Self-pay | Admitting: *Deleted

## 2016-02-09 ENCOUNTER — Ambulatory Visit (INDEPENDENT_AMBULATORY_CARE_PROVIDER_SITE_OTHER): Payer: 59 | Admitting: Family Medicine

## 2016-02-09 VITALS — BP 160/90 | HR 72 | Temp 97.8°F | Resp 17 | Ht 67.75 in | Wt 188.0 lb

## 2016-02-09 DIAGNOSIS — Z0000 Encounter for general adult medical examination without abnormal findings: Secondary | ICD-10-CM

## 2016-02-09 DIAGNOSIS — E785 Hyperlipidemia, unspecified: Secondary | ICD-10-CM | POA: Diagnosis not present

## 2016-02-09 DIAGNOSIS — Z23 Encounter for immunization: Secondary | ICD-10-CM | POA: Diagnosis not present

## 2016-02-09 DIAGNOSIS — Z91048 Other nonmedicinal substance allergy status: Secondary | ICD-10-CM | POA: Diagnosis not present

## 2016-02-09 DIAGNOSIS — I10 Essential (primary) hypertension: Secondary | ICD-10-CM | POA: Diagnosis not present

## 2016-02-09 DIAGNOSIS — Z9109 Other allergy status, other than to drugs and biological substances: Secondary | ICD-10-CM

## 2016-02-09 DIAGNOSIS — Z113 Encounter for screening for infections with a predominantly sexual mode of transmission: Secondary | ICD-10-CM

## 2016-02-09 LAB — LIPID PANEL
Cholesterol: 229 mg/dL — ABNORMAL HIGH (ref 125–200)
HDL: 67 mg/dL (ref >=40)
LDL CALC: 135 mg/dL — AB (ref <130)
Total CHOL/HDL Ratio: 3.4 Ratio (ref <=5.0)
Triglycerides: 133 mg/dL (ref <150)
VLDL: 27 mg/dL (ref <30)

## 2016-02-09 LAB — HIV ANTIBODY (ROUTINE TESTING W REFLEX): HIV: NONREACTIVE

## 2016-02-09 LAB — COMPLETE METABOLIC PANEL WITH GFR
ALT: 25 U/L (ref 9–46)
AST: 22 U/L (ref 10–40)
Albumin: 4 g/dL (ref 3.6–5.1)
Alkaline Phosphatase: 45 U/L (ref 40–115)
BILIRUBIN TOTAL: 0.4 mg/dL (ref 0.2–1.2)
BUN: 11 mg/dL (ref 7–25)
CO2: 26 mmol/L (ref 20–31)
CREATININE: 1.16 mg/dL (ref 0.60–1.35)
Calcium: 9.1 mg/dL (ref 8.6–10.3)
Chloride: 105 mmol/L (ref 98–110)
GFR, Est African American: 87 mL/min (ref >=60)
GFR, Est Non African American: 75 mL/min (ref >=60)
GLUCOSE: 94 mg/dL (ref 65–99)
Potassium: 3.9 mmol/L (ref 3.5–5.3)
SODIUM: 139 mmol/L (ref 135–146)
TOTAL PROTEIN: 6.9 g/dL (ref 6.1–8.1)

## 2016-02-09 MED ORDER — HYDROCHLOROTHIAZIDE 25 MG PO TABS: 25.0000 mg | ORAL_TABLET | Freq: Every day | ORAL | 1 refills | Status: DC

## 2016-02-09 MED ORDER — AMLODIPINE BESYLATE 2.5 MG PO TABS: 2.5000 mg | ORAL_TABLET | Freq: Every day | ORAL | 1 refills | Status: DC

## 2016-02-09 NOTE — Progress Notes (Signed)
By signing my name below I, Shelah Lewandowsky, attest that this documentation has been prepared under the direction and in the presence of Shade Flood, MD. Electonically Signed. Shelah Lewandowsky, Scribe 02/09/2016 at 9:16 AM  Subjective:    Patient ID: Dakota Bowman, male    DOB: [birth year], 45 y.o.   MRN: [id number]  Chief Complaint  Patient presents with  . Annual Exam    urine collected.     HPI Dakota Bowman is a 45 y.o. male who presents to the [facility] for annual physical. Pt reports that he has a mild HA this morning that he thinks is due to not eating today.   HTN BP was still elevated at last visit in March, started on higher dose of HCTZ at 25 mg, plan for revisit in 3 months. Today is his first visit since that time. Pt states that he measured his BP in August and his BP was 122/86. Pt states that his BP has usually been more elevated than that. Pt states he is taking his HCTZ as prescribed. Pt denies any CP, dizziness, SOB, lightheadedness.   Lab Results  Component Value Date   CREATININE 1.07 07/29/2015   Allergic Rhinitis Pt takes Zyrtec and flonase to control symptoms. Pt takes them as needed. Pt also uses a steroid cream as needed.   HLD Lab Results  Component Value Date   CHOL 211 (H) 07/29/2015   HDL 55 07/29/2015   LDLCALC 134 (H) 07/29/2015   TRIG 112 07/29/2015   CHOLHDL 3.8 07/29/2015   Plan for increase exercise and diet changes.  Cancer screening Pt denies any family history of cancer.  Immunizations Immunization History  Administered Date(s) Administered  . Influenza-Unspecified 03/24/2015  . Tdap 10/20/2011   Pt requesting flu shot today.  Depression screening Depression screen [facility] 2/9 02/09/2016 07/29/2015 04/27/2015 02/09/2015 10/01/2014  Decreased Interest 0 0 0 0 0  Down, Depressed, Hopeless 0 0 0 0 0  PHQ - 2 Score 0 0 0 0 0   Vision screening  Visual Acuity Screening   Right eye Left eye Both eyes  Without  correction:     With correction: 20 15 20 20 20 15     Exersise Pt states that he exercises once a week.   STI testing Pt is sexually active with his wife of more than 10 years. Pt denies any sexual intercourse outside of the marriage. HIV was negative in 2014.     Patient Active Problem List   Diagnosis Date Noted  . Essential hypertension 02/09/2015  . Hyperlipidemia 02/09/2015  . Health care maintenance 10/20/2011  . Tinea pedis 10/20/2011   Past Medical History:  Diagnosis Date  . Allergy   . Essential hypertension 02/09/2015  . Hyperlipidemia 02/09/2015   Past Surgical History:  Procedure Laterality Date  . NASAL SINUS SURGERY  2005   No Known Allergies Prior to Admission medications   Medication Sig Start Date End Date Taking? Authorizing Provider  cetirizine (ZYRTEC) 10 MG tablet Take 10 mg by mouth daily. Reported on 07/29/2015   Yes Historical Provider, MD  hydrochlorothiazide (HYDRODIURIL) 25 MG tablet Take 1 tablet (25 mg total) by mouth daily. 07/29/15  Yes Shade Flood, MD  triamcinolone cream (KENALOG) 0.1 % Apply topically 2 (two) times daily. 11/01/12  Yes Elvina Sidle, MD  fluticasone [facility]) 50 MCG/ACT nasal spray Place 2 sprays into the nose daily. 09/03/12 02/09/15  Elvina Sidle, MD   Social History  Social History  . Marital status: Single    Spouse name: N/A  . Number of children: N/A  . Years of education: N/A   Occupational History  . Not on file.   Social History Main Topics  . Smoking status: Never Smoker  . Smokeless tobacco: Never Used  . Alcohol use No  . Drug use: No  . Sexual activity: Yes   Other Topics Concern  . Not on file   Social History Narrative  . No narrative on file      Review of Systems Pt Positive for  HAs, Pt negative for Light-headedness or dizziness. 13 point ROS Otherwise Negative.     Objective:   Physical Exam  Constitutional: He is oriented to person, place, and time. He appears well-developed  and well-nourished.  HENT:  Head: Normocephalic and atraumatic.  Right Ear: External ear normal.  Left Ear: External ear normal.  Mouth/Throat: Oropharynx is clear and moist.  Eyes: Conjunctivae and EOM are normal. Pupils are equal, round, and reactive to light.  Neck: Normal range of motion. Neck supple. No thyromegaly present.  Cardiovascular: Normal rate, regular rhythm, normal heart sounds and intact distal pulses.   Pulmonary/Chest: Effort normal and breath sounds normal. No respiratory distress. He has no wheezes.  Abdominal: Soft. He exhibits no distension. There is no tenderness.  Musculoskeletal: Normal range of motion. He exhibits no edema or tenderness.  Lymphadenopathy:    He has no cervical adenopathy.  Neurological: He is alert and oriented to person, place, and time. He has normal reflexes.  Skin: Skin is warm and dry.  Psychiatric: He has a normal mood and affect. His behavior is normal.  Vitals reviewed.    Vitals:   02/09/16 0820  BP: (!) 160/90  Pulse: 72  Resp: 17  Temp: 97.8 F (36.6 C)  TempSrc: Oral  SpO2: 99%  Weight: 188 lb (85.3 kg)  Height: 5' 7.75" (1.721 m)         Assessment & Plan:   Dakota GarlandSaidou Aldous is a 45 y.o. male Annual physical exam  --anticipatory guidance as below in AVS, screening labs above. Health maintenance items as above in HPI discussed/recommended as applicable.   Essential hypertension - Plan: amLODipine (NORVASC) 2.5 MG tablet, hydrochlorothiazide (HYDRODIURIL) 25 MG tablet  -Increase exercise. Add amlodipine 2.5 mg daily. Check blood pressure outside of office and if remaining over 140/90 few weeks, return to discuss change in medications.  Hyperlipidemia - Plan: COMPLETE METABOLIC PANEL WITH GFR, Lipid panel  -Labs pending. Increase exercise.  Environmental allergies  -Allergen avoidance measures, over-the-counter antihistamine or steroid nasal spray as needed.  Flu vaccine need - Plan: Flu Vaccine QUAD 36+ mos IM  given   Routine screening for STI (sexually transmitted infection) - Plan: HIV antibody, GC/Chlamydia Probe Amp, RPR  -STI testing performed. Low risk based on history.  Meds ordered this encounter  Medications  . amLODipine (NORVASC) 2.5 MG tablet    Sig: Take 1 tablet (2.5 mg total) by mouth daily.    Dispense:  90 tablet    Refill:  1  . hydrochlorothiazide (HYDRODIURIL) 25 MG tablet    Sig: Take 1 tablet (25 mg total) by mouth daily.    Dispense:  90 tablet    Refill:  1    Tablet only (no gel/capsule)   Patient Instructions   Start exercise most days per week. See other recommendations below to keep you healthy. Continue hydrochlorothiazide once per day, add amlodipine once per day. Keep  an eye on your blood pressure readings outside of office, and if they are not below 140/90 in the next few weeks, return to discuss further medication changes. Otherwise follow-up with me in 6 months.  Keeping you healthy  Get these tests  Blood pressure- Have your blood pressure checked once a year by your healthcare provider.  Normal blood pressure is 120/80.  Weight- Have your body mass index (BMI) calculated to screen for obesity.  BMI is a measure of body fat based on height and weight. You can also calculate your own BMI at https://www.west-esparza.com/.  Cholesterol- Have your cholesterol checked regularly starting at age 21, sooner may be necessary if you have diabetes, high blood pressure, if a family member developed heart diseases at an early age or if you smoke.   Chlamydia, HIV, and other sexual transmitted disease- Get screened each year until the age of 28 then within three months of each new sexual partner.  Diabetes- Have your blood sugar checked regularly if you have high blood pressure, high cholesterol, a family history of diabetes or if you are overweight.  Get these vaccines  Flu shot- Every fall.  Tetanus shot- Every 10 years.  Menactra- Single dose; prevents  meningitis.  Take these steps  Don't smoke- If you do smoke, ask your healthcare provider about quitting. For tips on how to quit, go to www.smokefree.gov or call 1-800-QUIT-NOW.  Be physically active- Exercise 5 days a week for at least 30 minutes.  If you are not already physically active start slow and gradually work up to 30 minutes of moderate physical activity.  Examples of moderate activity include walking briskly, mowing the yard, dancing, swimming bicycling, etc.  Eat a healthy diet- Eat a variety of healthy foods such as fruits, vegetables, low fat milk, low fat cheese, yogurt, lean meats, poultry, fish, beans, tofu, etc.  For more information on healthy eating, go to www.thenutritionsource.org  Drink alcohol in moderation- Limit alcohol intake two drinks or less a day.  Never drink and drive.  Dentist- Brush and floss teeth twice daily; visit your dentis twice a year.  Depression-Your emotional health is as important as your physical health.  If you're feeling down, losing interest in things you normally enjoy please talk with your healthcare provider.  Gun Safety- If you keep a gun in your home, keep it unloaded and with the safety lock on.  Bullets should be stored separately.  Helmet use- Always wear a helmet when riding a motorcycle, bicycle, rollerblading or skateboarding.  Safe sex- If you may be exposed to a sexually transmitted infection, use a condom  Seat belts- Seat bels can save your life; always wear one.  Smoke/Carbon Monoxide detectors- These detectors need to be installed on the appropriate level of your home.  Replace batteries at least once a year.  Skin Cancer- When out in the sun, cover up and use sunscreen SPF 15 or higher.  Violence- If anyone is threatening or hurting you, please tell your healthcare provider.    IF you received an x-ray today, you will receive an invoice from [facility]. Please contact [facility] at [phone]  with questions or concerns regarding your invoice.   IF you received labwork today, you will receive an invoice from [employer]. Please contact [facility] at [phone] with questions or concerns regarding your invoice.   Our billing staff will not be able to assist you with questions regarding bills from these companies.  You will be contacted  with the lab results as soon as they are available. The fastest way to get your results is to activate your My Chart account. Instructions are located on the last page of this paperwork. If you have not heard from [place] regarding the results in 2 weeks, please contact this office.        I personally performed the services described in this documentation, which was scribed in my presence. The recorded information has been reviewed and considered, and addended by me as needed.   Signed,   Meredith Staggers, MD [facility].  02/10/16 2:36 PM

## 2016-02-09 NOTE — Patient Instructions (Addendum)
Start exercise most days per week. See other recommendations below to keep you healthy. Continue hydrochlorothiazide once per day, add amlodipine once per day. Keep an eye on your blood pressure readings outside of office, and if they are not below 140/90 in the next few weeks, return to discuss further medication changes. Otherwise follow-up with me in 6 months.  Keeping you healthy  Get these tests  Blood pressure- Have your blood pressure checked once a year by your healthcare provider.  Normal blood pressure is 120/80.  Weight- Have your body mass index (BMI) calculated to screen for obesity.  BMI is a measure of body fat based on height and weight. You can also calculate your own BMI at https://www.west-esparza.com/.  Cholesterol- Have your cholesterol checked regularly starting at age 44, sooner may be necessary if you have diabetes, high blood pressure, if a family member developed heart diseases at an early age or if you smoke.   Chlamydia, HIV, and other sexual transmitted disease- Get screened each year until the age of 6 then within three months of each new sexual partner.  Diabetes- Have your blood sugar checked regularly if you have high blood pressure, high cholesterol, a family history of diabetes or if you are overweight.  Get these vaccines  Flu shot- Every fall.  Tetanus shot- Every 10 years.  Menactra- Single dose; prevents meningitis.  Take these steps  Don't smoke- If you do smoke, ask your healthcare provider about quitting. For tips on how to quit, go to www.smokefree.gov or call 1-800-QUIT-NOW.  Be physically active- Exercise 5 days a week for at least 30 minutes.  If you are not already physically active start slow and gradually work up to 30 minutes of moderate physical activity.  Examples of moderate activity include walking briskly, mowing the yard, dancing, swimming bicycling, etc.  Eat a healthy diet- Eat a variety of healthy foods such as fruits, vegetables,  low fat milk, low fat cheese, yogurt, lean meats, poultry, fish, beans, tofu, etc.  For more information on healthy eating, go to www.thenutritionsource.org  Drink alcohol in moderation- Limit alcohol intake two drinks or less a day.  Never drink and drive.  Dentist- Brush and floss teeth twice daily; visit your dentis twice a year.  Depression-Your emotional health is as important as your physical health.  If you're feeling down, losing interest in things you normally enjoy please talk with your healthcare provider.  Gun Safety- If you keep a gun in your home, keep it unloaded and with the safety lock on.  Bullets should be stored separately.  Helmet use- Always wear a helmet when riding a motorcycle, bicycle, rollerblading or skateboarding.  Safe sex- If you may be exposed to a sexually transmitted infection, use a condom  Seat belts- Seat bels can save your life; always wear one.  Smoke/Carbon Monoxide detectors- These detectors need to be installed on the appropriate level of your home.  Replace batteries at least once a year.  Skin Cancer- When out in the sun, cover up and use sunscreen SPF 15 or higher.  Violence- If anyone is threatening or hurting you, please tell your healthcare provider.    IF you received an x-ray today, you will receive an invoice from [facility]. Please contact [facility] at [phone] with questions or concerns regarding your invoice.   IF you received labwork today, you will receive an invoice from [employer]. Please contact [facility] at [phone] with questions or concerns regarding your invoice.  Our billing staff will not be able to assist you with questions regarding bills from these companies.  You will be contacted with the lab results as soon as they are available. The fastest way to get your results is to activate your My Chart account. Instructions are located on the last page of this paperwork.  If you have not heard from us regarding the results in 2 weeks, please contact this office.

## 2016-02-10 LAB — GC/CHLAMYDIA PROBE AMP

## 2016-02-10 LAB — RPR

## 2016-05-24 ENCOUNTER — Ambulatory Visit: Payer: 59

## 2016-05-31 ENCOUNTER — Encounter (INDEPENDENT_AMBULATORY_CARE_PROVIDER_SITE_OTHER): Payer: 59 | Admitting: Physician Assistant

## 2016-05-31 NOTE — Patient Instructions (Signed)
     IF you received an x-ray today, you will receive an invoice from [facility]. Please contact  Radiology at [phone] with questions or concerns regarding your invoice.   IF you received labwork today, you will receive an invoice from [facility]. Please contact [facility] at [phone] with questions or concerns regarding your invoice.   Our billing staff will not be able to assist you with questions regarding bills from these companies.  You will be contacted with the lab results as soon as they are available. The fastest way to get your results is to activate your My Chart account. Instructions are located on the last page of this paperwork. If you have not heard from us regarding the results in 2 weeks, please contact this office.     

## 2016-06-14 ENCOUNTER — Ambulatory Visit (INDEPENDENT_AMBULATORY_CARE_PROVIDER_SITE_OTHER): Payer: 59 | Admitting: Family Medicine

## 2016-06-14 VITALS — BP 120/88 | HR 82 | Temp 98.1°F | Resp 16 | Ht 68.0 in | Wt 188.4 lb

## 2016-06-14 DIAGNOSIS — L309 Dermatitis, unspecified: Secondary | ICD-10-CM

## 2016-06-14 DIAGNOSIS — I10 Essential (primary) hypertension: Secondary | ICD-10-CM | POA: Diagnosis not present

## 2016-06-14 MED ORDER — AMLODIPINE BESYLATE 2.5 MG PO TABS: 2.5000 mg | ORAL_TABLET | Freq: Every day | ORAL | 1 refills | Status: DC

## 2016-06-14 MED ORDER — HYDROCHLOROTHIAZIDE 25 MG PO TABS: 25.0000 mg | ORAL_TABLET | Freq: Every day | ORAL | 1 refills | Status: DC

## 2016-06-14 MED ORDER — TRIAMCINOLONE ACETONIDE 0.1 % EX CREA: 1.0000 "application " | TOPICAL_CREAM | Freq: Two times a day (BID) | CUTANEOUS | 0 refills | Status: DC

## 2016-06-14 NOTE — Patient Instructions (Addendum)
Increase exercise to most days of the week to help with cholesterol and blood pressure. Recheck in 6 months with fasting labs.   Your hand rash appears to be contact dermatitis. Change to different brand of nitrile gloves, continue cotton glove below glove, and steroid cream up to twice per day to affected areas if needed. If not improving in the next 2-4 weeks, return for recheck.  Return to the clinic or go to the nearest emergency room if any of your symptoms worsen or new symptoms occur.   Hand Dermatitis Introduction Hand dermatitis is a skin condition that causes small, itchy, raised dots or fluid-filled blisters to form over the palms of the hands. This condition may also be called hand eczema. What are the causes? The cause of this condition is not known. What increases the risk? This condition is more likely to develop in people who have a history of allergies, such as:  Hay fever.  Allergic asthma.  An allergy to latex. Chemical exposure, injuries, and environmental irritants can make hand dermatitis worse. Washing your hands too often can remove natural oils, which can dry out the skin and contribute to outbreaks of this condition. What are the signs or symptoms? The most common symptom of this condition is intense itchiness. Cracks or grooves (fissures) on the fingers can also develop. Affected areas can be painful, especially areas where large blisters have formed. How is this diagnosed? This condition is diagnosed with a medical history and physical exam. How is this treated? This condition is treated with medicines, including:  Steroid creams and ointments.  Oral steroid medicines.  Antibiotic medicines. These are prescribed if you have an infection.  Antihistamine medicines. These help to reduce itchiness. Follow these instructions at home:  Take or apply over-the-counter and prescription medicines only as told by your health care provider.  If you were  prescribed an antibiotic medicine, use it as told by your health care provider. Do not stop using the antibiotic even if you start to feel better.  Avoid washing your hands more often than necessary.  Avoid using harsh chemicals on your hands.  Wear protective gloves when you handle products that can irritate your skin.  Keep all follow-up visits as told by your health care provider. This is important. Contact a health care provider if:  Your rash does not improve during the first week of treatment.  Your rash is red or tender.  Your rash has pus coming from it.  Your rash spreads. This information is not intended to replace advice given to you by your health care provider. Make sure you discuss any questions you have with your health care provider. Document Released: 05/09/2005 Document Revised: 10/15/2015 Document Reviewed: 11/21/2014  2017 Elsevier    IF you received an x-ray today, you will [facility]. Please contact [facility] at [phone] with questions or concerns regarding your invoice.   IF you received labwork today, you will receive an invoice from [facility]. Please contact [facility] at [phone] with questions or concerns regarding your invoice.   Our billing staff will not be able to assist you with questions regarding bills from these companies.  You will be contacted with the lab results as soon as they are available. The fastest way to get your results is to activate your My Chart account. Instructions are located on the last page of this paperwork. If you have not heard from us regarding the results in 2 weeks, please contact this  office.

## 2016-06-14 NOTE — Progress Notes (Signed)
Subjective:  By signing my name below, I, Stann Ore, attest that this documentation has been prepared under the direction and in the presence of Meredith Staggers, MD. Electronically Signed: Stann Ore, Scribe. 06/14/2016 , 9:20 AM .  Patient was seen in Room 11 .   Patient ID: Dakota Bowman, male    DOB: August 12, 1970, 46 y.o.   MRN: [id number] Chief Complaint  Patient presents with  . Follow-up    BP   HPI Dakota Bowman is a 46 y.o. male He has history of HTN. He was last seen in Sept 2017. Here for follow up. He is not fasting today.   Rash He's been using nitrile gloves at work. He's been applying steroid cream prescribed by Dr. Milus Glazier prior to wearing gloves at work.   HTN His BP was elevated at 160/90 at that time. I added Norvasc 2.5mg  qd, and continued HCTZ 25mg  qd. We discussed exercise.   He states he's doing well. He rarely misses his medications, about 2~3 times total. He denies chest pain, headache, or lightheadedness.   Exercise He's been exercising about 1~3 times, but not consistent. He plans to be more consistent and increase activity when weather becomes warmer.   Wt Readings from Last 3 Encounters:  06/14/16 188 lb 6.4 oz (85.5 kg)  05/31/16 186 lb 9.6 oz (84.6 kg)  02/09/16 188 lb (85.3 kg)    Hyperlipidemia Lab Results  Component Value Date   CHOL 229 (H) 02/09/2016   HDL 67 02/09/2016   LDLCALC 135 (H) 02/09/2016   TRIG 133 02/09/2016   CHOLHDL 3.4 02/09/2016   Plan on increased exercise.   Patient Active Problem List   Diagnosis Date Noted  . Essential hypertension 02/09/2015  . Hyperlipidemia 02/09/2015  . Health care maintenance 10/20/2011  . Tinea pedis 10/20/2011   Past Medical History:  Diagnosis Date  . Allergy   . Essential hypertension 02/09/2015  . Hyperlipidemia 02/09/2015   Past Surgical History:  Procedure Laterality Date  . NASAL SINUS SURGERY  2005   No Known Allergies Prior to Admission medications     Medication Sig Start Date End Date Taking? Authorizing Provider  amLODipine (NORVASC) 2.5 MG tablet Take 1 tablet (2.5 mg total) by mouth daily. 02/09/16  Yes Shade Flood, MD  cetirizine (ZYRTEC) 10 MG tablet Take 10 mg by mouth daily. Reported on 07/29/2015   Yes Historical Provider, MD  hydrochlorothiazide (HYDRODIURIL) 25 MG tablet Take 1 tablet (25 mg total) by mouth daily. 02/09/16  Yes Shade Flood, MD  triamcinolone cream (KENALOG) 0.1 % Apply topically 2 (two) times daily. 11/01/12  Yes Elvina Sidle, MD  fluticasone [facility]) 50 MCG/ACT nasal spray Place 2 sprays into the nose daily. 09/03/12 02/09/15  Elvina Sidle, MD   Social History   Social History  . Marital status: Single    Spouse name: N/A  . Number of children: N/A  . Years of education: N/A   Occupational History  . Not on file.   Social History Main Topics  . Smoking status: Never Smoker  . Smokeless tobacco: Never Used  . Alcohol use No  . Drug use: No  . Sexual activity: Yes   Other Topics Concern  . Not on file   Social History Narrative  . No narrative on file   Review of Systems  Constitutional: Negative for fatigue and unexpected weight change.  Eyes: Negative for visual disturbance.  Respiratory: Negative for cough, chest tightness and shortness of breath.  Cardiovascular: Negative for chest pain, palpitations and leg swelling.  Gastrointestinal: Negative for abdominal pain and blood in stool.  Skin: Positive for rash.  Neurological: Negative for dizziness, light-headedness and headaches.       Objective:   Physical Exam  Constitutional: He is oriented to person, place, and time. He appears well-developed and well-nourished.  HENT:  Head: Normocephalic and atraumatic.  Eyes: EOM are normal. Pupils are equal, round, and reactive to light.  Neck: No JVD present. Carotid bruit is not present.  Cardiovascular: Normal rate, regular rhythm and normal heart sounds.   No murmur  heard. Pulmonary/Chest: Effort normal and breath sounds normal. He has no rales.  Musculoskeletal: He exhibits no edema.  Neurological: He is alert and oriented to person, place, and time.  Skin: Skin is warm and dry.  Right hand: dorsum of right hand, greater than left hand, there are hyperpigmented areas with thickened skin  Psychiatric: He has a normal mood and affect.  Vitals reviewed.   Vitals:   06/14/16 0842  BP: 120/88  Pulse: 82  Resp: 16  Temp: 98.1 F (36.7 C)  TempSrc: Oral  SpO2: 98%  Weight: 188 lb 6.4 oz (85.5 kg)  Height: 5\' 8"  (1.727 m)      Assessment & Plan:    Dakota Bowman is a 46 y.o. male Hand dermatitis - Plan: triamcinolone cream (KENALOG) 0.1 %  - Contact dermatitis from gloves likely. Can change brand of glove, continue cotton glove underneath, triamcinolone topical twice a day when necessary. RTC precautions if persistent next few weeks.  Essential hypertension - Plan: hydrochlorothiazide (HYDRODIURIL) 25 MG tablet, amLODipine (NORVASC) 2.5 MG tablet, Basic metabolic panel  - Improved control.  Continue same doses of meds, BMP pending. Plan on repeat visit in 6 months for fasting labs. Advised to increase exercise to most days per week.  Meds ordered this encounter  Medications  . hydrochlorothiazide (HYDRODIURIL) 25 MG tablet    Sig: Take 1 tablet (25 mg total) by mouth daily.    Dispense:  90 tablet    Refill:  1    Tablet only (no gel/capsule)  . amLODipine (NORVASC) 2.5 MG tablet    Sig: Take 1 tablet (2.5 mg total) by mouth daily.    Dispense:  90 tablet    Refill:  1  . triamcinolone cream (KENALOG) 0.1 %    Sig: Apply 1 application topically 2 (two) times daily.    Dispense:  30 g    Refill:  0   Patient Instructions    Increase exercise to most days of the week to help with cholesterol and blood pressure. Recheck in 6 months with fasting labs.   Your hand rash appears to be contact dermatitis. Change to different brand of  nitrile gloves, continue cotton glove below glove, and steroid cream up to twice per day to affected areas if needed. If not improving in the next 2-4 weeks, return for recheck.  Return to the clinic or go to the nearest emergency room if any of your symptoms worsen or new symptoms occur.   Hand Dermatitis Introduction Hand dermatitis is a skin condition that causes small, itchy, raised dots or fluid-filled blisters to form over the palms of the hands. This condition may also be called hand eczema. What are the causes? The cause of this condition is not known. What increases the risk? This condition is more likely to develop in people who have a history of allergies, such as:  Hay fever.  Allergic asthma.  An allergy to latex. Chemical exposure, injuries, and environmental irritants can make hand dermatitis worse. Washing your hands too often can remove natural oils, which can dry out the skin and contribute to outbreaks of this condition. What are the signs or symptoms? The most common symptom of this condition is intense itchiness. Cracks or grooves (fissures) on the fingers can also develop. Affected areas can be painful, especially areas where large blisters have formed. How is this diagnosed? This condition is diagnosed with a medical history and physical exam. How is this treated? This condition is treated with medicines, including:  Steroid creams and ointments.  Oral steroid medicines.  Antibiotic medicines. These are prescribed if you have an infection.  Antihistamine medicines. These help to reduce itchiness. Follow these instructions at home:  Take or apply over-the-counter and prescription medicines only as told by your health care provider.  If you were prescribed an antibiotic medicine, use it as told by your health care provider. Do not stop using the antibiotic even if you start to feel better.  Avoid washing your hands more often than necessary.  Avoid using  harsh chemicals on your hands.  Wear protective gloves when you handle products that can irritate your skin.  Keep all follow-up visits as told by your health care provider. This is important. Contact a health care provider if:  Your rash does not improve during the first week of treatment.  Your rash is red or tender.  Your rash has pus coming from it.  Your rash spreads. This information is not intended to replace advice given to you by your health care provider. Make sure you discuss any questions you have with your health care provider. Document Released: 05/09/2005 Document Revised: 10/15/2015 Document Reviewed: 11/21/2014  2017 Elsevier    IF you received an x-ray today, you will receive an invoice from [facility]. Please contact [facility] at [phone] with questions or concerns regarding your invoice.   IF you received labwork today, you will receive an invoice from [facility]. Please contact [facility] at [phone] with questions or concerns regarding your invoice.   Our billing staff will not be able to assist you with questions regarding bills from these companies.  You will be contacted with the lab results as soon as they are available. The fastest way to get your results is to activate your My Chart account. Instructions are located on the last page of this paperwork. If you have not heard from us regarding the results in 2 weeks, please contact this office.       I personally performed the services described in this documentation, which was scribed in my presence. The recorded information has been reviewed and considered, and addended by me as needed.   Signed,   Meredith StaggersJeffrey Kassaundra Hair, MD [facility].  06/14/16 9:26 AM

## 2016-06-15 LAB — BASIC METABOLIC PANEL
BUN/Creatinine Ratio: 13 (ref 9–20)
BUN: 15 mg/dL (ref 6–24)
CALCIUM: 10 mg/dL (ref 8.7–10.2)
CHLORIDE: 98 mmol/L (ref 96–106)
CO2: 22 mmol/L (ref 18–29)
Creatinine, Ser: 1.14 mg/dL (ref 0.76–1.27)
GFR, EST AFRICAN AMERICAN: 88 mL/min/{1.73_m2} (ref >59)
GFR, EST NON AFRICAN AMERICAN: 76 mL/min/{1.73_m2} (ref >59)
Glucose: 126 mg/dL — ABNORMAL HIGH (ref 65–99)
Potassium: 3.5 mmol/L (ref 3.5–5.2)
Sodium: 139 mmol/L (ref 134–144)

## 2016-08-03 NOTE — Progress Notes (Signed)
This encounter was created in error - please disregard.

## 2016-11-29 ENCOUNTER — Other Ambulatory Visit: Payer: Self-pay | Admitting: Family Medicine

## 2016-11-29 DIAGNOSIS — L309 Dermatitis, unspecified: Secondary | ICD-10-CM

## 2016-12-05 ENCOUNTER — Telehealth: Payer: Self-pay | Admitting: Family Medicine

## 2016-12-05 NOTE — Telephone Encounter (Signed)
LMOM FOR PT TO GIVE US A CALL TO SCHEDULE AN APPOINTMENT FOR MED REFILL ON TRIAMCINOLONE CREA

## 2016-12-05 NOTE — Telephone Encounter (Signed)
LMOM FOR PT TO GIVE US A CALL TO SCHEDULE AN APPOINTMENT FOR MED REFILL ON TRIAMCINOLONE CREAM

## 2017-02-14 ENCOUNTER — Ambulatory Visit (INDEPENDENT_AMBULATORY_CARE_PROVIDER_SITE_OTHER): Payer: 59 | Admitting: Family Medicine

## 2017-02-14 ENCOUNTER — Encounter: Payer: Self-pay | Admitting: Family Medicine

## 2017-02-14 VITALS — BP 122/78 | HR 75 | Temp 98.2°F | Resp 17 | Ht 68.5 in | Wt 181.0 lb

## 2017-02-14 DIAGNOSIS — I10 Essential (primary) hypertension: Secondary | ICD-10-CM

## 2017-02-14 DIAGNOSIS — Z9109 Other allergy status, other than to drugs and biological substances: Secondary | ICD-10-CM | POA: Diagnosis not present

## 2017-02-14 DIAGNOSIS — Z0000 Encounter for general adult medical examination without abnormal findings: Secondary | ICD-10-CM | POA: Diagnosis not present

## 2017-02-14 DIAGNOSIS — Z23 Encounter for immunization: Secondary | ICD-10-CM | POA: Diagnosis not present

## 2017-02-14 DIAGNOSIS — Z125 Encounter for screening for malignant neoplasm of prostate: Secondary | ICD-10-CM | POA: Diagnosis not present

## 2017-02-14 DIAGNOSIS — L309 Dermatitis, unspecified: Secondary | ICD-10-CM

## 2017-02-14 DIAGNOSIS — E785 Hyperlipidemia, unspecified: Secondary | ICD-10-CM

## 2017-02-14 MED ORDER — HYDROCHLOROTHIAZIDE 25 MG PO TABS
25.0000 mg | ORAL_TABLET | Freq: Every day | ORAL | 3 refills | Status: DC
Start: 2017-02-14 — End: 2018-02-20

## 2017-02-14 MED ORDER — TRIAMCINOLONE ACETONIDE 0.1 % EX CREA: 1.0000 "application " | TOPICAL_CREAM | Freq: Two times a day (BID) | CUTANEOUS | 3 refills | Status: DC

## 2017-02-14 MED ORDER — AMLODIPINE BESYLATE 2.5 MG PO TABS: 2.5000 mg | ORAL_TABLET | Freq: Every day | ORAL | 3 refills | Status: DC

## 2017-02-14 NOTE — Patient Instructions (Addendum)
Continue zyrtec, flonase as needed for allergies.   Steroid cream if needed for rash on hand.  Continue same meds for blood pressure.   I will check cholesterol, but increase exercise to most days per week. Follow-up in one year for a physical.  Keeping you healthy  Get these tests  Blood pressure- Have your blood pressure checked once a year by your healthcare provider.  Normal blood pressure is 120/80.  Weight- Have your body mass index (BMI) calculated to screen for obesity.  BMI is a measure of body fat based on height and weight. You can also calculate your own BMI at https://www.west-esparza.com/.  Cholesterol- Have your cholesterol checked regularly starting at age 73, sooner may be necessary if you have diabetes, high blood pressure, if a family member developed heart diseases at an early age or if you smoke.   Chlamydia, HIV, and other sexual transmitted disease- Get screened each year until the age of 42 then within three months of each new sexual partner.  Diabetes- Have your blood sugar checked regularly if you have high blood pressure, high cholesterol, a family history of diabetes or if you are overweight.  Get these vaccines  Flu shot- Every fall.  Tetanus shot- Every 10 years.  Menactra- Single dose; prevents meningitis.  Take these steps  Don't smoke- If you do smoke, ask your healthcare provider about quitting. For tips on how to quit, go to www.smokefree.gov or call 1-800-QUIT-NOW.  Be physically active- Exercise 5 days a week for at least 30 minutes.  If you are not already physically active start slow and gradually work up to 30 minutes of moderate physical activity.  Examples of moderate activity include walking briskly, mowing the yard, dancing, swimming bicycling, etc.  Eat a healthy diet- Eat a variety of healthy foods such as fruits, vegetables, low fat milk, low fat cheese, yogurt, lean meats, poultry, fish, beans, tofu, etc.  For more information on  healthy eating, go to www.thenutritionsource.org  Drink alcohol in moderation- Limit alcohol intake two drinks or less a day.  Never drink and drive.  Dentist- Brush and floss teeth twice daily; visit your dentis twice a year.  Depression-Your emotional health is as important as your physical health.  If you're feeling down, losing interest in things you normally enjoy please talk with your healthcare provider.  Gun Safety- If you keep a gun in your home, keep it unloaded and with the safety lock on.  Bullets should be stored separately.  Helmet use- Always wear a helmet when riding a motorcycle, bicycle, rollerblading or skateboarding.  Safe sex- If you may be exposed to a sexually transmitted infection, use a condom  Seat belts- Seat bels can save your life; always wear one.  Smoke/Carbon Monoxide detectors- These detectors need to be installed on the appropriate level of your home.  Replace batteries at least once a year.  Skin Cancer- When out in the sun, cover up and use sunscreen SPF 15 or higher.  Violence- If anyone is threatening or hurting you, please tell your healthcare provider.  IF you received an x-ray today, you will receive an invoice from [facility]. Please contact [facility] at [phone] with questions or concerns regarding your invoice.   IF you received labwork today, you will receive an invoice from [place]. Please contact [facility] at [phone] with questions or concerns regarding your invoice.   Our billing staff will not be able to assist you with questions regarding bills from these companies.  You will be contacted with the lab results as soon as they are available. The fastest way to get your results is to activate your My Chart account. Instructions are located on the last page of this paperwork. If you have not heard from [place] regarding the results in 2 weeks, please contact this office.

## 2017-02-14 NOTE — Progress Notes (Signed)
Subjective:  By signing my name below, I, Dakota Bowman, attest that this documentation has been prepared under the direction and in the presence of Dakota Flood, MD Electronically Signed: Charline Bowman, ED Scribe 02/14/2017 at 9:09 AM.   Patient ID: Dakota Bowman, male    DOB: 02-12-1971, 46 y.o.   MRN: [id number]  Chief Complaint  Patient presents with  . Annual Exam   HPI Dakota Bowman is a 46 y.o. male who presents to Primary Care at [facility] for an annual exam. H/o HTN, allergic rhinitis, hyperlipidemia and intermittent rash of hands thought to be contact dermatitis from gloves.  Rash Pt is treating hand rash with triamcinolone cream with relief which he only applies when he works. He is also wearing cotton gloves underneath latex gloves.    Allergies Pt reports some sinus pressure. He takes Zyrtec and Flonase PRN for allergies.   HTN Additional Norvasc prescribed last year. Improved control at January office visit. Takes amlodipine 2.5 mg qd. HCTZ 25 mg qd. Pt occasionally checks his BP at [facility] with readings of 130/90. Denies side-effects.   Hyperlipidemia Lab Results  Component Value Date   CHOL 229 (H) 02/09/2016   HDL 67 02/09/2016   LDLCALC 135 (H) 02/09/2016   TRIG 133 02/09/2016   CHOLHDL 3.4 02/09/2016   Lab Results  Component Value Date   ALT 25 02/09/2016   AST 22 02/09/2016   ALKPHOS 45 02/09/2016   BILITOT 0.4 02/09/2016  Discussed exercise and recheck levels in 6 months.   Hyperglycemia  Noted on BMP in January. Pt was not fasting at that visit. His last meal was 1 AM today.   CA Screening Prostate CA Screening: No family hx. Lab Results  Component Value Date   PSA 0.31 02/09/2015   Immunizations Immunization History  Administered Date(s) Administered  . Influenza,inj,Quad PF,6+ Mos 02/09/2016, 02/14/2017  . Influenza-Unspecified 03/24/2015  . Tdap 10/20/2011   Depression Screening Depression screen [facility] 2/9 02/14/2017 06/14/2016  05/31/2016 02/09/2016 07/29/2015  Decreased Interest 0 0 0 0 0  Down, Depressed, Hopeless 0 0 0 0 0  PHQ - 2 Score 0 0 0 0 0     Visual Acuity Screening   Right eye Left eye Both eyes  Without correction:     With correction:   Dentist: sees dentist every 6 months; appointment next month  Exercise: jogs 2-3 days/week STI Testing: pt declines STI testing today  Patient Active Problem List   Diagnosis Date Noted  . Essential hypertension 02/09/2015  . Hyperlipidemia 02/09/2015  . Health care maintenance 10/20/2011  . Tinea pedis 10/20/2011   Past Medical History:  Diagnosis Date  . Allergy   . Essential hypertension 02/09/2015  . Hyperlipidemia 02/09/2015   Past Surgical History:  Procedure Laterality Date  . NASAL SINUS SURGERY  2005   No Known Allergies Prior to Admission medications   Medication Sig Start Date End Date Taking? Authorizing Provider  amLODipine (NORVASC) 2.5 MG tablet Take 1 tablet (2.5 mg total) by mouth daily. 06/14/16   Dakota Flood, MD  cetirizine (ZYRTEC) 10 MG tablet Take 10 mg by mouth daily. Reported on 07/29/2015    [provider]  fluticasone (FLONASE) 50 MCG/ACT nasal spray Place 2 sprays into the nose daily. 09/03/12 02/09/15  Elvina Sidle, MD  hydrochlorothiazide (HYDRODIURIL) 25 MG tablet Take 1 tablet (25 mg total) by mouth daily. 06/14/16   Dakota Flood, MD  triamcinolone cream (KENALOG) 0.1 % Apply 1  application topically 2 (two) times daily. 06/14/16   Dakota Flood, MD   Social History   Social History  . Marital status: Single    Spouse name: N/A  . Number of children: N/A  . Years of education: N/A   Occupational History  . Not on file.   Social History Main Topics  . Smoking status: Never Smoker  . Smokeless tobacco: Never Used  . Alcohol use No  . Drug use: No  . Sexual activity: Yes   Other Topics Concern  . Not on file   Social History Narrative  . No narrative on file   Review of  Systems  HENT: Positive for sinus pressure.       Objective:   Physical Exam  Constitutional: He is oriented to person, place, and time. He appears well-developed and well-nourished.  HENT:  Head: Normocephalic and atraumatic.  Right Ear: External ear normal.  Left Ear: External ear normal.  Mouth/Throat: Oropharynx is clear and moist.  Eyes: Pupils are equal, round, and reactive to light. Conjunctivae and EOM are normal.  Neck: Normal range of motion. Neck supple. No thyromegaly present.  Cardiovascular: Normal rate, regular rhythm, normal heart sounds and intact distal pulses.   Pulmonary/Chest: Effort normal and breath sounds normal. No respiratory distress. He has no wheezes.  Abdominal: Soft. He exhibits no distension. There is no tenderness. Hernia confirmed negative in the right inguinal area and confirmed negative in the left inguinal area.  Genitourinary: Prostate normal.  Musculoskeletal: Normal range of motion. He exhibits no edema or tenderness.  Lymphadenopathy:    He has no cervical adenopathy.  Neurological: He is alert and oriented to person, place, and time. He has normal reflexes.  Skin: Skin is warm and dry.  Psychiatric: He has a normal mood and affect. His behavior is normal.  Vitals reviewed.    Vitals:   02/14/17 0851  BP: 122/78  Pulse: 75  Resp: 17  Temp: 98.2 F (36.8 C)  TempSrc: Oral  SpO2: 98%  Weight: 181 lb (82.1 kg)  Height: 5' 8.5" (1.74 m)      Assessment & Plan:   Dakota Bowman is a 46 y.o. male Annual physical exam  - -anticipatory guidance as below in AVS, screening labs above. Health maintenance items as above in HPI discussed/recommended as applicable.   - Form provided for work, will be completed once labs have returned  - Increase exercise to most days per week.  Need for prophylactic vaccination and inoculation against influenza - Plan: Flu Vaccine QUAD 6+ mos PF IM (Fluarix Quad PF)  Essential hypertension - Plan:  amLODipine (NORVASC) 2.5 MG tablet, hydrochlorothiazide (HYDRODIURIL) 25 MG tablet, Comprehensive metabolic panel, Lipid panel  - Stable, controlled, continue same doses of medications, recheck in one year. Check labs  Environmental allergies  - Continue Flonase, Zyrtec over-the-counter as needed.  Hand dermatitis - Plan: triamcinolone cream (KENALOG) 0.1 %  - Controlled with intermittent dosing of triamcinolone cream. Continue cotton gloves as a liner for nitrile gloves. Potential risks of hypo-/hyperpigmentation with chronic topical steroid use discussed. RTC precautions if worsening.  Hyperlipidemia, unspecified hyperlipidemia type  - Labs pending. Based on previous levels, can work on diet/exercise initially.  Screening for prostate cancer - Plan: PSA  -We discussed pros and cons of prostate cancer screening, and after this discussion, he chose to have screening done. PSA obtained, and no concerning findings on DRE.    Meds ordered this encounter  Medications  . amLODipine (NORVASC)  2.5 MG tablet    Sig: Take 1 tablet (2.5 mg total) by mouth daily.    Dispense:  90 tablet    Refill:  3  . hydrochlorothiazide (HYDRODIURIL) 25 MG tablet    Sig: Take 1 tablet (25 mg total) by mouth daily.    Dispense:  90 tablet    Refill:  3    Tablet only (no gel/capsule)  . triamcinolone cream (KENALOG) 0.1 %    Sig: Apply 1 application topically 2 (two) times daily.    Dispense:  30 g    Refill:  3   Patient Instructions    Continue zyrtec, flonase as needed for allergies.   Steroid cream if needed for rash on hand.  Continue same meds for blood pressure.   I will check cholesterol, but increase exercise to most days per week. Follow-up in one year for a physical.  Keeping you healthy  Get these tests  Blood pressure- Have your blood pressure checked once a year by your healthcare provider.  Normal blood pressure is 120/80.  Weight- Have your body mass index (BMI) calculated to  screen for obesity.  BMI is a measure of body fat based on height and weight. You can also calculate your own BMI at https://www.west-esparza.com/.  Cholesterol- Have your cholesterol checked regularly starting at age 36, sooner may be necessary if you have diabetes, high blood pressure, if a family member developed heart diseases at an early age or if you smoke.   Chlamydia, HIV, and other sexual transmitted disease- Get screened each year until the age of 70 then within three months of each new sexual partner.  Diabetes- Have your blood sugar checked regularly if you have high blood pressure, high cholesterol, a family history of diabetes or if you are overweight.  Get these vaccines  Flu shot- Every fall.  Tetanus shot- Every 10 years.  Menactra- Single dose; prevents meningitis.  Take these steps  Don't smoke- If you do smoke, ask your healthcare provider about quitting. For tips on how to quit, go to www.smokefree.gov or call 1-800-QUIT-NOW.  Be physically active- Exercise 5 days a week for at least 30 minutes.  If you are not already physically active start slow and gradually work up to 30 minutes of moderate physical activity.  Examples of moderate activity include walking briskly, mowing the yard, dancing, swimming bicycling, etc.  Eat a healthy diet- Eat a variety of healthy foods such as fruits, vegetables, low fat milk, low fat cheese, yogurt, lean meats, poultry, fish, beans, tofu, etc.  For more information on healthy eating, go to www.thenutritionsource.org  Drink alcohol in moderation- Limit alcohol intake two drinks or less a day.  Never drink and drive.  Dentist- Brush and floss teeth twice daily; visit your dentis twice a year.  Depression-Your emotional health is as important as your physical health.  If you're feeling down, losing interest in things you normally enjoy please talk with your healthcare provider.  Gun Safety- If you keep a gun in your home, keep it unloaded  and with the safety lock on.  Bullets should be stored separately.  Helmet use- Always wear a helmet when riding a motorcycle, bicycle, rollerblading or skateboarding.  Safe sex- If you may be exposed to a sexually transmitted infection, use a condom  Seat belts- Seat bels can save your life; always wear one.  Smoke/Carbon Monoxide detectors- These detectors need to be installed on the appropriate level of your home.  Replace batteries at  least once a year.  Skin Cancer- When out in the sun, cover up and use sunscreen SPF 15 or higher.  Violence- If anyone is threatening or hurting you, please tell your healthcare provider.  IF you received an x-ray today, you will receive an invoice from [facility]. Please [facility] at [phone] with questions or concerns regarding your invoice.   IF you received labwork today, you will receive an invoice from [place]. Please contact [facility] at [phone] with questions or concerns regarding your invoice.   Our billing staff will not be able to assist you with questions regarding Bowman from these companies.  You will be contacted with the lab results as soon as they are available. The fastest way to get your results is to activate your My Chart account. Instructions are located on the last page of this paperwork. If you have not heard from [place] regarding the results in 2 weeks, please contact this office.       I personally performed the services described in this documentation, which was scribed in my presence. The recorded information has been reviewed and considered for accuracy and completeness, addended by me as needed, and agree with information above.  Signed,   Meredith Staggers, MD Primary Care at [facility].  02/14/17 9:27 AM

## 2017-02-15 LAB — COMPREHENSIVE METABOLIC PANEL
ALK PHOS: 62 IU/L (ref 39–117)
ALT: 16 IU/L (ref 0–44)
AST: 24 IU/L (ref 0–40)
Albumin/Globulin Ratio: 1.6 (ref 1.2–2.2)
Albumin: 4.4 g/dL (ref 3.5–5.5)
BUN / CREAT RATIO: 10 (ref 9–20)
BUN: 12 mg/dL (ref 6–24)
CHLORIDE: 104 mmol/L (ref 96–106)
CO2: 26 mmol/L (ref 20–29)
CREATININE: 1.21 mg/dL (ref 0.76–1.27)
Calcium: 9.1 mg/dL (ref 8.7–10.2)
GFR calc Af Amer: 82 mL/min/{1.73_m2} (ref >59)
GFR calc non Af Amer: 71 mL/min/{1.73_m2} (ref >59)
GLUCOSE: 87 mg/dL (ref 65–99)
Globulin, Total: 2.7 g/dL (ref 1.5–4.5)
Potassium: 3.8 mmol/L (ref 3.5–5.2)
Sodium: 143 mmol/L (ref 134–144)
Total Protein: 7.1 g/dL (ref 6.0–8.5)

## 2017-02-15 LAB — LIPID PANEL
Chol/HDL Ratio: 3 ratio (ref 0.0–5.0)
Cholesterol, Total: 198 mg/dL (ref 100–199)
HDL: 65 mg/dL (ref >39)
LDL CALC: 117 mg/dL — AB (ref 0–99)
TRIGLYCERIDES: 80 mg/dL (ref 0–149)
VLDL CHOLESTEROL CAL: 16 mg/dL (ref 5–40)

## 2017-02-15 LAB — PSA: Prostate Specific Ag, Serum: 0.3 ng/mL (ref 0.0–4.0)

## 2017-06-23 ENCOUNTER — Ambulatory Visit: Payer: Self-pay | Admitting: *Deleted

## 2017-06-23 ENCOUNTER — Other Ambulatory Visit: Payer: Self-pay

## 2017-06-23 ENCOUNTER — Emergency Department (HOSPITAL_COMMUNITY): Payer: Managed Care, Other (non HMO)

## 2017-06-23 ENCOUNTER — Emergency Department (HOSPITAL_COMMUNITY)
Admission: EM | Admit: 2017-06-23 | Discharge: 2017-06-23 | Disposition: A | Discharge status: Home / Self Care | Payer: Managed Care, Other (non HMO) | Attending: Emergency Medicine | Admitting: Emergency Medicine

## 2017-06-23 ENCOUNTER — Encounter (HOSPITAL_COMMUNITY): Payer: Self-pay | Admitting: *Deleted

## 2017-06-23 DIAGNOSIS — Z79899 Other long term (current) drug therapy: Secondary | ICD-10-CM | POA: Diagnosis not present

## 2017-06-23 DIAGNOSIS — R079 Chest pain, unspecified: Secondary | ICD-10-CM | POA: Diagnosis present

## 2017-06-23 DIAGNOSIS — R002 Palpitations: Secondary | ICD-10-CM | POA: Insufficient documentation

## 2017-06-23 DIAGNOSIS — I10 Essential (primary) hypertension: Secondary | ICD-10-CM | POA: Diagnosis not present

## 2017-06-23 LAB — CBC
HCT: 44.9 % (ref 39.0–52.0)
HEMOGLOBIN: 15.4 g/dL (ref 13.0–17.0)
MCH: 27.5 pg (ref 26.0–34.0)
MCHC: 34.3 g/dL (ref 30.0–36.0)
MCV: 80 fL (ref 78.0–100.0)
Platelets: 236 10*3/uL (ref 150–400)
RBC: 5.61 MIL/uL (ref 4.22–5.81)
RDW: 12.5 % (ref 11.5–15.5)
WBC: 4.5 10*3/uL (ref 4.0–10.5)

## 2017-06-23 LAB — BASIC METABOLIC PANEL
ANION GAP: 13 (ref 5–15)
BUN: 13 mg/dL (ref 6–20)
CHLORIDE: 98 mmol/L — AB (ref 101–111)
CO2: 26 mmol/L (ref 22–32)
Calcium: 10.1 mg/dL (ref 8.9–10.3)
Creatinine, Ser: 1.12 mg/dL (ref 0.61–1.24)
GFR calc non Af Amer: >60 mL/min (ref >60)
Glucose, Bld: 96 mg/dL (ref 65–99)
Potassium: 5.4 mmol/L — ABNORMAL HIGH (ref 3.5–5.1)
Sodium: 137 mmol/L (ref 135–145)

## 2017-06-23 LAB — TSH: TSH: 2.723 u[IU]/mL (ref 0.350–4.500)

## 2017-06-23 LAB — I-STAT TROPONIN, ED: TROPONIN I, POC: 0.01 ng/mL (ref 0.00–0.08)

## 2017-06-23 NOTE — ED Notes (Signed)
Pt comfortable at this time. Able to eat crackers and drink juice and water without difficulty. Denies N/V

## 2017-06-23 NOTE — Telephone Encounter (Signed)
Called in c/o "a pressure in my head".   "It's not a headache but a bad pressure that started 3 days ago".  "My heart is also beating fast".    [facility] 171/110.   He checked it yesterday and it was 200/100 something.  He could not remember the diastolic number.    I instructed him to go to the ED now.   [facility].  I routed a note to Dr. Paralee CancelGreene's nurse pool making them aware.  Reason for Disposition . [1] Systolic BP  >= 160 OR Diastolic >= 100 AND [2] cardiac or neurologic symptoms (e.g., chest pain, difficulty breathing, unsteady gait, blurred vision)  Answer Assessment - Initial Assessment Questions 1. BLOOD PRESSURE: "What is the blood pressure?" "Did you take at least two measurements 5 minutes apart?"     171[facility]110 [facility]  Yesterday it was 200/100 something could not remember exact number.   Also c/o "pressure on my head and in the back of my head" "It's not a headache" .  "It's a pressure".   My heart is also beating fast that started today. 2. ONSET: "When did you take your blood pressure?"     See above. 3. HOW: "How did you obtain the blood pressure?" (e.g., visiting nurse, automatic home BP monitor)     At [facility] BP machine 4. HISTORY: "Do you have a history of high blood pressure?"     Yes 5. MEDICATIONS: "Are you taking any medications for blood pressure?" "Have you missed any doses recently?"     Not asked.   Sent to the ED. 6. OTHER SYMPTOMS: "Do you have any symptoms?" (e.g., headache, chest pain, blurred vision, difficulty breathing, weakness)     After telling me how high the BP was I sent him to the ED 7. PREGNANCY: "Is there any chance you are pregnant?" "When was your last menstrual period?"     N/A  Protocols used: HIGH BLOOD PRESSURE-A-AH

## 2017-06-23 NOTE — ED Provider Notes (Signed)
[facility] EMERGENCY DEPARTMENT Provider Note   CSN: [id number] Arrival date & time: 06/23/17  1514     History   Chief Complaint Chief Complaint  Patient presents with  . Chest Pain  . Hypertension    HPI Yaseen Gilberg is a 47 y.o. male.  Patient is a 47 year old male with a history of hypertension who presents with palpitations.  He states since yesterday he had feelings that his heart was racing.  He is also had some intermittent headaches.  He has some discomfort in his chest but only when his heart is racing.  No shortness of breath.  No nausea or vomiting.  No diaphoresis.  No lightheadedness when this happens.  He has noted that his blood pressures been elevated over the last 2 days as well.  He denies any vision changes.  No speech deficits.  No numbness or weakness to his extremities.  He states he is actually feeling a little bit better now and denies any current palpitations.  He does drink a little bit of ice tea but usually no more than 1 or 2 glasses a day.  He denies any other over-the-counter medication use.  He denies any drug use.  No known history of thyroid disease.      Past Medical History:  Diagnosis Date  . Allergy   . Essential hypertension 02/09/2015  . Hyperlipidemia 02/09/2015    Patient Active Problem List   Diagnosis Date Noted  . Essential hypertension 02/09/2015  . Hyperlipidemia 02/09/2015  . Health care maintenance 10/20/2011  . Tinea pedis 10/20/2011    Past Surgical History:  Procedure Laterality Date  . NASAL SINUS SURGERY  2005       Home Medications    Prior to Admission medications   Medication Sig Start Date End Date Taking? Authorizing Provider  amLODipine (NORVASC) 2.5 MG tablet Take 1 tablet (2.5 mg total) by mouth daily. 02/14/17  Yes Shade Flood, MD  hydrochlorothiazide (HYDRODIURIL) 25 MG tablet Take 1 tablet (25 mg total) by mouth daily. 02/14/17  Yes Shade Flood, MD  triamcinolone cream  (KENALOG) 0.1 % Apply 1 application topically 2 (two) times daily. 02/14/17  Yes Shade Flood, MD  fluticasone (FLONASE) 50 MCG/ACT nasal spray Place 2 sprays into the nose daily. Patient not taking: Reported on 06/23/2017 09/03/12 02/09/15  Elvina Sidle, MD    Family History No family history on file.  Social History Social History   Tobacco Use  . Smoking status: Never Smoker  . Smokeless tobacco: Never Used  Substance Use Topics  . Alcohol use: No    Alcohol/week: 0.0 oz  . Drug use: No     Allergies   Patient has no known allergies.   Review of Systems Review of Systems  Constitutional: Negative for chills, diaphoresis, fatigue and fever.  HENT: Negative for congestion, rhinorrhea and sneezing.   Eyes: Negative.   Respiratory: Negative for cough, chest tightness and shortness of breath.   Cardiovascular: Positive for palpitations. Negative for chest pain and leg swelling.  Gastrointestinal: Negative for abdominal pain, blood in stool, diarrhea, nausea and vomiting.  Genitourinary: Negative for difficulty urinating, flank pain, frequency and hematuria.  Musculoskeletal: Negative for arthralgias and back pain.  Skin: Negative for rash.  Neurological: Positive for headaches. Negative for dizziness, speech difficulty, weakness and numbness.     Physical Exam Updated Vital Signs BP (!) 133/99   Pulse 72   Temp 98.8 F (37.1 C) (Oral)  Resp 15   Ht 5\' 8"  (1.727 m)   Wt 80.7 kg (178 lb)   SpO2 99%   BMI 27.06 kg/m   Physical Exam  Constitutional: He is oriented to person, place, and time. He appears well-developed and well-nourished.  HENT:  Head: Normocephalic and atraumatic.  Eyes: Pupils are equal, round, and reactive to light.  Neck: Normal range of motion. Neck supple.  Cardiovascular: Normal rate, regular rhythm and normal heart sounds.  Pulmonary/Chest: Effort normal and breath sounds normal. No respiratory distress. He has no wheezes. He has no  rales. He exhibits no tenderness.  Abdominal: Soft. Bowel sounds are normal. There is no tenderness. There is no rebound and no guarding.  Musculoskeletal: Normal range of motion. He exhibits no edema.  Lymphadenopathy:    He has no cervical adenopathy.  Neurological: He is alert and oriented to person, place, and time.  Motor 5/5 all extremities Sensation grossly intact to LT all extremities Finger to Nose intact, no pronator drift CN II-XII grossly intact Gait normal   Skin: Skin is warm and dry. No rash noted.  Psychiatric: He has a normal mood and affect.     ED Treatments / Results  Labs (all labs ordered are listed, but only abnormal results are displayed) Labs Reviewed  BASIC METABOLIC PANEL - Abnormal; Notable for the following components:      Result Value   Potassium 5.4 (*)    Chloride 98 (*)    All other components within normal limits  CBC  TSH  I-STAT TROPONIN, ED    EKG  EKG Interpretation  Date/Time:  Friday June 23 2017 15:25:03 EST Ventricular Rate:  118 PR Interval:  162 QRS Duration: 76 QT Interval:  324 QTC Calculation: 454 R Axis:   -96 Text Interpretation:  Sinus tachycardia Biatrial enlargement Right superior axis deviation Cannot rule out Anterior infarct , age undetermined Abnormal ECG No old tracing to compare Confirmed by Rolan Bucco [phone]) on 06/23/2017 6:26:40 PM       Radiology Dg Chest 2 View  Result Date: 06/23/2017 CLINICAL DATA:  Chest pain EXAM: CHEST  2 VIEW COMPARISON:  None. FINDINGS: Heart and mediastinal contours are within normal limits. No focal opacities or effusions. No acute bony abnormality. IMPRESSION: No active cardiopulmonary disease. Electronically Signed   By: Charlett Nose M.D.   On: 06/23/2017 16:18    Procedures Procedures (including critical care time)  Medications Ordered in ED Medications - No data to display   Initial Impression / Assessment and Plan / ED Course  I have reviewed the triage vital  signs and the nursing notes.  Pertinent labs & imaging results that were available during my care of the patient were reviewed by me and considered in my medical decision making (see chart for details).     Patient presents with palpitations and elevated blood pressure.  His EKG shows a sinus rhythm without arrhythmia or ischemic changes.  I did check labs which are non-concerning.  His potassium was mildly elevated but it was a hemolyzed specimen.  His kidney function is normal.  His heart rate has normalized and his blood pressure has markedly improved.  He is currently symptom-free.  He was discharged home in good condition.  He was encouraged to follow-up with his PCP on Monday.  He may need a Holter monitor if his palpitations continue.  I encouraged him to stay away from caffeine.  Return precautions were given.  Final Clinical Impressions(s) / ED Diagnoses  Final diagnoses:  Hypertension, unspecified type  Palpitations    ED Discharge Orders    None       Rolan BuccoBelfi, Kian Ottaviano, MD 06/23/17 2052

## 2017-06-23 NOTE — ED Triage Notes (Signed)
Pt c/o htn in spite of compliance with amlodipine and HCTZ, pt reports L sided CP with fast HR, pt pain described as burning, pt reports symptom onset last night, pt A&O x4, denies SOB, denies n/v/d, denies blurred vision

## 2017-06-23 NOTE — ED Notes (Signed)
Unsuccessful attempt to start IV x 1. Able to obtain blood work

## 2017-06-23 NOTE — ED Notes (Signed)
Pt given juice and ice water with crackers

## 2017-06-27 ENCOUNTER — Ambulatory Visit: Payer: 59 | Admitting: Family Medicine

## 2017-06-27 ENCOUNTER — Other Ambulatory Visit: Payer: Self-pay

## 2017-06-27 ENCOUNTER — Ambulatory Visit: Payer: Managed Care, Other (non HMO) | Admitting: Family Medicine

## 2017-06-27 ENCOUNTER — Encounter: Payer: Self-pay | Admitting: Family Medicine

## 2017-06-27 VITALS — BP 128/88 | HR 76 | Temp 98.0°F | Resp 16 | Ht 68.5 in | Wt 178.0 lb

## 2017-06-27 DIAGNOSIS — R9431 Abnormal electrocardiogram [ECG] [EKG]: Secondary | ICD-10-CM | POA: Diagnosis not present

## 2017-06-27 DIAGNOSIS — R079 Chest pain, unspecified: Secondary | ICD-10-CM

## 2017-06-27 DIAGNOSIS — E875 Hyperkalemia: Secondary | ICD-10-CM | POA: Diagnosis not present

## 2017-06-27 DIAGNOSIS — I10 Essential (primary) hypertension: Secondary | ICD-10-CM

## 2017-06-27 DIAGNOSIS — R002 Palpitations: Secondary | ICD-10-CM

## 2017-06-27 NOTE — Progress Notes (Signed)
Subjective:  By signing my name below, I, Dakota Bowman, attest that this documentation has been prepared under the direction and in the presence of Shade Flood, MD Electronically Signed: Charline Bills, ED Scribe 06/27/2017 at 11:55 AM.   Patient ID: Dakota Bowman, male    DOB: [birth year], 47 y.o.   MRN: [id number]  Chief Complaint  Patient presents with  . Hypertension    follow-up was seen in the ER on 06/23/2017   HPI Dakota Bowman is a 47 y.o. male who presents to Primary Care at [facility] for f/u of HTN and ER visit. Seen 4 days ago in the ER with palpitations, heart racing, cp with palpitations only. Reportedly elevated home BPs. BP in ER 139/99. Notable labs of potassium of 5.4 but hemolyzed specimen. CXR without acute findings. EKG reported without acute findings, but on review: sinus tachycardia, possible R axis deviation, nonspecific changes anteriorly. Troponin normal. TSH normal. CBC normal. He improved during ER visit. Recommended caffeine avoidance but no med changes. Continued HCTZ 25 mg qd, Norvasc 2.5 mg qd.  Pt states that he awoke with heart racing and burning sensation in his chest. States that was his first time having palpitations and he has not experienced anymore since ER visit. Pt states that he checked his BP the night prior to symptoms with a reading of 200/112. Typically drinks 1-2 cups of tea/day. Denies missing a dose of BP meds, increased stress, cp since visit, alcohol use, blood in stools, melena, any symptoms with elevated BP.  Pt works 7pm-7am 3-4 days/wk. States he goes to sleep around 8:30/9 AM, picks up his children around 1:30 PM from school and watches them until he goes into work at 7 PM. Reports he averages ~3-4 hours/sleep a day when he works but goes to bed early when he is off.  Patient Active Problem List   Diagnosis Date Noted  . Essential hypertension 02/09/2015  . Hyperlipidemia 02/09/2015  . Health care maintenance 10/20/2011  . Tinea  pedis 10/20/2011   Past Medical History:  Diagnosis Date  . Allergy   . Essential hypertension 02/09/2015  . Hyperlipidemia 02/09/2015   Past Surgical History:  Procedure Laterality Date  . NASAL SINUS SURGERY  2005   No Known Allergies Prior to Admission medications   Medication Sig Start Date End Date Taking? Authorizing Provider  amLODipine (NORVASC) 2.5 MG tablet Take 1 tablet (2.5 mg total) by mouth daily. 02/14/17  Yes Shade Flood, MD  hydrochlorothiazide (HYDRODIURIL) 25 MG tablet Take 1 tablet (25 mg total) by mouth daily. 02/14/17  Yes Shade Flood, MD  triamcinolone cream (KENALOG) 0.1 % Apply 1 application topically 2 (two) times daily. 02/14/17  Yes Shade Flood, MD  fluticasone (FLONASE) 50 MCG/ACT nasal spray Place 2 sprays into the nose daily. Patient not taking: Reported on 06/23/2017 09/03/12 02/09/15  Elvina Sidle, MD   Social History   Socioeconomic History  . Marital status: Married    Spouse name: Not on file  . Number of children: Not on file  . Years of education: Not on file  . Highest education level: Not on file  Social Needs  . Financial resource strain: Not on file  . Food insecurity - worry: Not on file  . Food insecurity - inability: Not on file  . Transportation needs - medical: Not on file  . Transportation needs - non-medical: Not on file  Occupational History  . Not on file  Tobacco Use  . Smoking status:  Never Smoker  . Smokeless tobacco: Never Used  Substance and Sexual Activity  . Alcohol use: No    Alcohol/week: 0.0 oz  . Drug use: No  . Sexual activity: Yes  Other Topics Concern  . Not on file  Social History Narrative  . Not on file   Review of Systems  Constitutional: Negative for fatigue and unexpected weight change.  Eyes: Negative for visual disturbance.  Respiratory: Negative for cough, chest tightness and shortness of breath.   Cardiovascular: Negative for chest pain (resolved), palpitations (resolved) and  leg swelling.  Gastrointestinal: Negative for abdominal pain and blood in stool.  Neurological: Negative for dizziness, light-headedness and headaches.  Psychiatric/Behavioral: Positive for sleep disturbance.      Objective:   Physical Exam  Constitutional: He is oriented to person, place, and time. He appears well-developed and well-nourished.  HENT:  Head: Normocephalic and atraumatic.  Eyes: EOM are normal. Pupils are equal, round, and reactive to light.  Neck: No JVD present. Carotid bruit is not present.  Cardiovascular: Normal rate, regular rhythm and normal heart sounds.  No murmur heard. Pulmonary/Chest: Effort normal and breath sounds normal. He has no rales.  Musculoskeletal: He exhibits no edema.  Neurological: He is alert and oriented to person, place, and time.  Skin: Skin is warm and dry.  Psychiatric: He has a normal mood and affect.  Vitals reviewed.  Vitals:   06/27/17 1142  BP: 128/88  Pulse: 76  Resp: 16  Temp: 98 F (36.7 C)  TempSrc: Oral  SpO2: 99%  Weight: 178 lb (80.7 kg)  Height: 5' 8.5" (1.74 m)   EKG reading done by Shade FloodJeffrey R Dylan Monforte, MD: sinus rhythm rate 62. Nonspecific ST and T waves, V2 and V3.    Assessment & Plan:   Dakota Bowman is a 47 y.o. male Palpitations - Plan: Metanephrines, plasma, EKG 12-Lead, Ambulatory referral to Cardiology  Chest pain, unspecified type - Plan: Metanephrines, plasma, EKG 12-Lead, Ambulatory referral to Cardiology  Essential hypertension - Plan: Metanephrines, plasma  Hyperkalemia - Plan: Basic metabolic panel  Nonspecific abnormal electrocardiogram (ECG) (EKG) - Plan: Ambulatory referral to Cardiology  Episode of chest pain, heart palpitations at ER as above. Does have some episodic loss of sleep due to shift work and home responsibilities. Denies situational stressors. Thyroid testing, other blood work overall reassuring in ER, including normal troponin. Hyperkalemia may be due to hemolysis. We'll repeat  a BMP today.  - With spikes in pressure and palpitations as above, check plasma metanephrines but less likely pheochromocytoma  - Continue same antihypertensives for now given current readings, refer to cardiology for possible abnormal EKG. ER precautions given/911 precautions given if return of chest pain. Recheck 1 month   No orders of the defined types were placed in this encounter.  Patient Instructions   I will refer you to cardiology to evaluate the EKG and decide if other testing is needed. If any return of chest pain, proceed to the emergency room.  Blood pressure looks better here today. I will check some other blood work as we discussed, and follow-up with me in the next 1 month. Try to obtain at least 7 hours of sleep per night.   Return to the clinic or go to the nearest emergency room if any of your symptoms worsen or new symptoms occur.   Nonspecific Chest Pain Chest pain can be caused by many different conditions. There is always a chance that your pain could be related to something serious, such  as a heart attack or a blood clot in your lungs. Chest pain can also be caused by conditions that are not life-threatening. If you have chest pain, it is very important to follow up with your health care provider. What are the causes? Causes of this condition include:  Heartburn.  Pneumonia or bronchitis.  Anxiety or stress.  Inflammation around your heart (pericarditis) or lung (pleuritis or pleurisy).  A blood clot in your lung.  A collapsed lung (pneumothorax). This can develop suddenly on its own (spontaneous pneumothorax) or from trauma to the chest.  Shingles infection (varicella-zoster virus).  Heart attack.  Damage to the bones, muscles, and cartilage that make up your chest wall. This can include: ? Bruised bones due to injury. ? Strained muscles or cartilage due to frequent or repeated coughing or overwork. ? Fracture to one or more ribs. ? Sore cartilage due  to inflammation (costochondritis).  What increases the risk? Risk factors for this condition may include:  Activities that increase your risk for trauma or injury to your chest.  Respiratory infections or conditions that cause frequent coughing.  Medical conditions or overeating that can cause heartburn.  Heart disease or family history of heart disease.  Conditions or health behaviors that increase your risk of developing a blood clot.  Having had chicken pox (varicella zoster).  What are the signs or symptoms? Chest pain can feel like:  Burning or tingling on the surface of your chest or deep in your chest.  Crushing, pressure, aching, or squeezing pain.  Dull or sharp pain that is worse when you move, cough, or take a deep breath.  Pain that is also felt in your back, neck, shoulder, or arm, or pain that spreads to any of these areas.  Your chest pain may come and go, or it may stay constant. How is this diagnosed? Lab tests or other studies may be needed to find the cause of your pain. Your health care provider may have you take a test called an ECG (electrocardiogram). An ECG records your heartbeat patterns at the time the test is performed. You may also have other tests, such as:  Transthoracic echocardiogram (TTE). In this test, sound waves are used to create a picture of the heart structures and to look at how blood flows through your heart.  Transesophageal echocardiogram (TEE).This is a more advanced imaging test that takes images from inside your body. It allows your health care provider to see your heart in finer detail.  Cardiac monitoring. This allows your health care provider to monitor your heart rate and rhythm in real time.  Holter monitor. This is a portable device that records your heartbeat and can help to diagnose abnormal heartbeats. It allows your health care provider to track your heart activity for several days, if needed.  Stress tests. These can be  done through exercise or by taking medicine that makes your heart beat more quickly.  Blood tests.  Other imaging tests.  How is this treated? Treatment depends on what is causing your chest pain. Treatment may include:  Medicines. These may include: ? Acid blockers for heartburn. ? Anti-inflammatory medicine. ? Pain medicine for inflammatory conditions. ? Antibiotic medicine, if an infection is present. ? Medicines to dissolve blood clots. ? Medicines to treat coronary artery disease (CAD).  Supportive care for conditions that do not require medicines. This may include: ? Resting. ? Applying heat or cold packs to injured areas. ? Limiting activities until pain decreases.  Follow  these instructions at home: Medicines  If you were prescribed an antibiotic, take it as told by your health care provider. Do not stop taking the antibiotic even if you start to feel better.  Take over-the-counter and prescription medicines only as told by your health care provider. Lifestyle  Do not use any products that contain nicotine or tobacco, such as cigarettes and e-cigarettes. If you need help quitting, ask your health care provider.  Do not drink alcohol.  Make lifestyle changes as directed by your health care provider. These may include: ? Getting regular exercise. Ask your health care provider to suggest some activities that are safe for you. ? Eating a heart-healthy diet. A registered dietitian can help you to learn healthy eating options. ? Maintaining a healthy weight. ? Managing diabetes, if necessary. ? Reducing stress, such as with yoga or relaxation techniques. General instructions  Avoid any activities that bring on chest pain.  If heartburn is the cause for your chest pain, raise (elevate) the head of your bed about 6 inches (15 cm) by putting blocks under the legs. Sleeping with more pillows does not effectively relieve heartburn because it only changes the position of your  head.  Keep all follow-up visits as told by your health care provider. This is important. This includes any further testing if your chest pain does not go away. Contact a health care provider if:  Your chest pain does not go away.  You have a rash with blisters on your chest.  You have a fever.  You have chills. Get help right away if:  Your chest pain is worse.  You have a cough that gets worse, or you cough up blood.  You have severe pain in your abdomen.  You have severe weakness.  You faint.  You have sudden, unexplained chest discomfort.  You have sudden, unexplained discomfort in your arms, back, neck, or jaw.  You have shortness of breath at any time.  You suddenly start to sweat, or your skin gets clammy.  You feel nauseous or you vomit.  You suddenly feel light-headed or dizzy.  Your heart begins to beat quickly, or it feels like it is skipping beats. These symptoms may represent a serious problem that is an emergency. Do not wait to see if the symptoms will go away. Get medical help right away. Call your local emergency services (911 in the [place] Do not drive yourself to the hospital. This information is not intended to replace advice given to you by your health care provider. Make sure you discuss any questions you have with your health care provider. Document Released: 02/16/2005 Document Revised: 02/01/2016 Document Reviewed: 02/01/2016 Elsevier Interactive Patient Education  2017 [employer].  Palpitations A palpitation is the feeling that your heartbeat is irregular or is faster than normal. It may feel like your heart is fluttering or skipping a beat. Palpitations are usually not a serious problem. They may be caused by many things, including smoking, caffeine, alcohol, stress, and certain medicines. Although most causes of palpitations are not serious, palpitations can be a sign of a serious medical problem. In some cases, you may need further medical  evaluation. Follow these instructions at home: Pay attention to any changes in your symptoms. Take these actions to help with your condition:  Avoid the following: ? Caffeinated coffee, tea, soft drinks, diet pills, and energy drinks. ? Chocolate. ? Alcohol.  Do not use any tobacco products, such as cigarettes, chewing tobacco, and e-cigarettes. If you  need help quitting, ask your health care provider.  Try to reduce your stress and anxiety. Things that can help you relax include: ? Yoga. ? Meditation. ? Physical activity, such as swimming, jogging, or walking. ? Biofeedback. This is a method that helps you learn to use your mind to control things in your body, such as your heartbeats.  Get plenty of rest and sleep.  Take over-the-counter and prescription medicines only as told by your health care provider.  Keep all follow-up visits as told by your health care provider. This is important.  Contact a health care provider if:  You continue to have a fast or irregular heartbeat after 24 hours.  Your palpitations occur more often. Get help right away if:  You have chest pain or shortness of breath.  You have a severe headache.  You feel dizzy or you faint. This information is not intended to replace advice given to you by your health care provider. Make sure you discuss any questions you have with your health care provider. Document Released: 05/06/2000 Document Revised: 10/12/2015 Document Reviewed: 01/22/2015 Elsevier Interactive Patient Education  2018 [employer].   IF you received an x-ray today, you will receive an invoice from [facility]. Please contact [facility] at [phone] with questions or concerns regarding your invoice.   IF you received labwork today, you will receive an invoice from [place]. Please contact [facility] at [phone] with questions or concerns regarding your invoice.   Our billing staff will not be able to assist you  with questions regarding bills from these companies.  You will be contacted with the lab results as soon as they are available. The fastest way to get your results is to activate your My Chart account. Instructions are located on the last page of this paperwork. If you have not heard from [place] regarding the results in 2 weeks, please contact this office.       I personally performed the services described in this documentation, which was scribed in my presence. The recorded information has been reviewed and considered for accuracy and completeness, addended by me as needed, and agree with information above.  Signed,   Meredith Staggers, MD Primary Care at [facility].  06/27/17 12:47 PM

## 2017-06-27 NOTE — Patient Instructions (Addendum)
I will refer you to cardiology to evaluate the EKG and decide if other testing is needed. If any return of chest pain, proceed to the emergency room.  Blood pressure looks better here today. I will check some other blood work as we discussed, and follow-up with me in the next 1 month. Try to obtain at least 7 hours of sleep per night.   Return to the clinic or go to the nearest emergency room if any of your symptoms worsen or new symptoms occur.   Nonspecific Chest Pain Chest pain can be caused by many different conditions. There is always a chance that your pain could be related to something serious, such as a heart attack or a blood clot in your lungs. Chest pain can also be caused by conditions that are not life-threatening. If you have chest pain, it is very important to follow up with your health care provider. What are the causes? Causes of this condition include:  Heartburn.  Pneumonia or bronchitis.  Anxiety or stress.  Inflammation around your heart (pericarditis) or lung (pleuritis or pleurisy).  A blood clot in your lung.  A collapsed lung (pneumothorax). This can develop suddenly on its own (spontaneous pneumothorax) or from trauma to the chest.  Shingles infection (varicella-zoster virus).  Heart attack.  Damage to the bones, muscles, and cartilage that make up your chest wall. This can include: ? Bruised bones due to injury. ? Strained muscles or cartilage due to frequent or repeated coughing or overwork. ? Fracture to one or more ribs. ? Sore cartilage due to inflammation (costochondritis).  What increases the risk? Risk factors for this condition may include:  Activities that increase your risk for trauma or injury to your chest.  Respiratory infections or conditions that cause frequent coughing.  Medical conditions or overeating that can cause heartburn.  Heart disease or family history of heart disease.  Conditions or health behaviors that increase your  risk of developing a blood clot.  Having had chicken pox (varicella zoster).  What are the signs or symptoms? Chest pain can feel like:  Burning or tingling on the surface of your chest or deep in your chest.  Crushing, pressure, aching, or squeezing pain.  Dull or sharp pain that is worse when you move, cough, or take a deep breath.  Pain that is also felt in your back, neck, shoulder, or arm, or pain that spreads to any of these areas.  Your chest pain may come and go, or it may stay constant. How is this diagnosed? Lab tests or other studies may be needed to find the cause of your pain. Your health care provider may have you take a test called an ECG (electrocardiogram). An ECG records your heartbeat patterns at the time the test is performed. You may also have other tests, such as:  Transthoracic echocardiogram (TTE). In this test, sound waves are used to create a picture of the heart structures and to look at how blood flows through your heart.  Transesophageal echocardiogram (TEE).This is a more advanced imaging test that takes images from inside your body. It allows your health care provider to see your heart in finer detail.  Cardiac monitoring. This allows your health care provider to monitor your heart rate and rhythm in real time.  Holter monitor. This is a portable device that records your heartbeat and can help to diagnose abnormal heartbeats. It allows your health care provider to track your heart activity for several days, if needed.  Stress tests. These can be done through exercise or by taking medicine that makes your heart beat more quickly.  Blood tests.  Other imaging tests.  How is this treated? Treatment depends on what is causing your chest pain. Treatment may include:  Medicines. These may include: ? Acid blockers for heartburn. ? Anti-inflammatory medicine. ? Pain medicine for inflammatory conditions. ? Antibiotic medicine, if an infection is  present. ? Medicines to dissolve blood clots. ? Medicines to treat coronary artery disease (CAD).  Supportive care for conditions that do not require medicines. This may include: ? Resting. ? Applying heat or cold packs to injured areas. ? Limiting activities until pain decreases.  Follow these instructions at home: Medicines  If you were prescribed an antibiotic, take it as told by your health care provider. Do not stop taking the antibiotic even if you start to feel better.  Take over-the-counter and prescription medicines only as told by your health care provider. Lifestyle  Do not use any products that contain nicotine or tobacco, such as cigarettes and e-cigarettes. If you need help quitting, ask your health care provider.  Do not drink alcohol.  Make lifestyle changes as directed by your health care provider. These may include: ? Getting regular exercise. Ask your health care provider to suggest some activities that are safe for you. ? Eating a heart-healthy diet. A registered dietitian can help you to learn healthy eating options. ? Maintaining a healthy weight. ? Managing diabetes, if necessary. ? Reducing stress, such as with yoga or relaxation techniques. General instructions  Avoid any activities that bring on chest pain.  If heartburn is the cause for your chest pain, raise (elevate) the head of your bed about 6 inches (15 cm) by putting blocks under the legs. Sleeping with more pillows does not effectively relieve heartburn because it only changes the position of your head.  Keep all follow-up visits as told by your health care provider. This is important. This includes any further testing if your chest pain does not go away. Contact a health care provider if:  Your chest pain does not go away.  You have a rash with blisters on your chest.  You have a fever.  You have chills. Get help right away if:  Your chest pain is worse.  You have a cough that gets  worse, or you cough up blood.  You have severe pain in your abdomen.  You have severe weakness.  You faint.  You have sudden, unexplained chest discomfort.  You have sudden, unexplained discomfort in your arms, back, neck, or jaw.  You have shortness of breath at any time.  You suddenly start to sweat, or your skin gets clammy.  You feel nauseous or you vomit.  You suddenly feel light-headed or dizzy.  Your heart begins to beat quickly, or it feels like it is skipping beats. These symptoms may represent a serious problem that is an emergency. Do not wait to see if the symptoms will go away. Get medical help right away. Call your local emergency services (911 in the [place] Do not drive yourself to the hospital. This information is not intended to replace advice given to you by your health care provider. Make sure you discuss any questions you have with your health care provider. Document Released: 02/16/2005 Document Revised: 02/01/2016 Document Reviewed: 02/01/2016 Elsevier Interactive Patient Education  2017 [employer].  Palpitations A palpitation is the feeling that your heartbeat is irregular or is faster than normal. It  may feel like your heart is fluttering or skipping a beat. Palpitations are usually not a serious problem. They may be caused by many things, including smoking, caffeine, alcohol, stress, and certain medicines. Although most causes of palpitations are not serious, palpitations can be a sign of a serious medical problem. In some cases, you may need further medical evaluation. Follow these instructions at home: Pay attention to any changes in your symptoms. Take these actions to help with your condition:  Avoid the following: ? Caffeinated coffee, tea, soft drinks, diet pills, and energy drinks. ? Chocolate. ? Alcohol.  Do not use any tobacco products, such as cigarettes, chewing tobacco, and e-cigarettes. If you need help quitting, ask your health care  provider.  Try to reduce your stress and anxiety. Things that can help you relax include: ? Yoga. ? Meditation. ? Physical activity, such as swimming, jogging, or walking. ? Biofeedback. This is a method that helps you learn to use your mind to control things in your body, such as your heartbeats.  Get plenty of rest and sleep.  Take over-the-counter and prescription medicines only as told by your health care provider.  Keep all follow-up visits as told by your health care provider. This is important.  Contact a health care provider if:  You continue to have a fast or irregular heartbeat after 24 hours.  Your palpitations occur more often. Get help right away if:  You have chest pain or shortness of breath.  You have a severe headache.  You feel dizzy or you faint. This information is not intended to replace advice given to you by your health care provider. Make sure you discuss any questions you have with your health care provider. Document Released: 05/06/2000 Document Revised: 10/12/2015 Document Reviewed: 01/22/2015 Elsevier Interactive Patient Education  2018 [employer].   IF you received an x-ray today, you will receive an invoice from [facility]. Please contact [facility] at [phone] with questions or concerns regarding your invoice.   IF you received labwork today, you will receive an invoice from [place]. Please contact [facility] at [phone] with questions or concerns regarding your invoice.   Our billing staff will not be able to assist you with questions regarding bills from these companies.  You will be contacted with the lab results as soon as they are available. The fastest way to get your results is to activate your My Chart account. Instructions are located on the last page of this paperwork. If you have not heard from [place] regarding the results in 2 weeks, please contact this office.

## 2017-06-30 LAB — METANEPHRINES, PLASMA
METANEPHRINE FREE: 47 pg/mL (ref 0–62)
Normetanephrine, Free: 66 pg/mL (ref 0–145)

## 2017-06-30 LAB — BASIC METABOLIC PANEL
BUN/Creatinine Ratio: 17 (ref 9–20)
BUN: 22 mg/dL (ref 6–24)
CO2: 23 mmol/L (ref 20–29)
CREATININE: 1.29 mg/dL — AB (ref 0.76–1.27)
Calcium: 9.4 mg/dL (ref 8.7–10.2)
Chloride: 103 mmol/L (ref 96–106)
GFR calc non Af Amer: 65 mL/min/{1.73_m2} (ref >59)
GFR, EST AFRICAN AMERICAN: 75 mL/min/{1.73_m2} (ref >59)
Glucose: 84 mg/dL (ref 65–99)
Potassium: 3.7 mmol/L (ref 3.5–5.2)
SODIUM: 141 mmol/L (ref 134–144)

## 2017-07-11 ENCOUNTER — Encounter: Payer: Self-pay | Admitting: *Deleted

## 2017-07-24 ENCOUNTER — Other Ambulatory Visit: Payer: Self-pay | Admitting: Cardiology

## 2017-07-24 DIAGNOSIS — R079 Chest pain, unspecified: Secondary | ICD-10-CM

## 2017-07-24 DIAGNOSIS — R9431 Abnormal electrocardiogram [ECG] [EKG]: Secondary | ICD-10-CM

## 2017-07-25 ENCOUNTER — Ambulatory Visit
Admission: RE | Admit: 2017-07-25 | Discharge: 2017-07-25 | Disposition: A | Discharge status: Home / Self Care | Payer: Managed Care, Other (non HMO) | Source: Ambulatory Visit | Attending: Cardiology | Admitting: Cardiology

## 2017-07-25 DIAGNOSIS — R9431 Abnormal electrocardiogram [ECG] [EKG]: Secondary | ICD-10-CM

## 2017-07-25 DIAGNOSIS — R079 Chest pain, unspecified: Secondary | ICD-10-CM

## 2017-07-25 MED ORDER — IOPAMIDOL (ISOVUE-370) INJECTION 76%
75.0000 mL | Freq: Once | INTRAVENOUS | Status: AC | PRN
  Administered 2017-07-25: 75 mL via INTRAVENOUS

## 2017-07-29 ENCOUNTER — Ambulatory Visit: Payer: Managed Care, Other (non HMO) | Admitting: Family Medicine

## 2017-07-29 ENCOUNTER — Encounter: Payer: Self-pay | Admitting: Family Medicine

## 2017-07-29 VITALS — BP 130/88 | HR 62 | Temp 97.6°F | Ht 68.0 in | Wt 178.0 lb

## 2017-07-29 DIAGNOSIS — R002 Palpitations: Secondary | ICD-10-CM | POA: Diagnosis not present

## 2017-07-29 DIAGNOSIS — E7800 Pure hypercholesterolemia, unspecified: Secondary | ICD-10-CM

## 2017-07-29 DIAGNOSIS — R7989 Other specified abnormal findings of blood chemistry: Secondary | ICD-10-CM | POA: Diagnosis not present

## 2017-07-29 DIAGNOSIS — I10 Essential (primary) hypertension: Secondary | ICD-10-CM

## 2017-07-29 NOTE — Patient Instructions (Addendum)
I'm glad to hear that your heart palpitations have resolved. Blood pressure looks okay today, I would not recommend any changes. Continue follow-up for the testing that was recommended by cardiology. If you have any return of heart palpitations or similar symptoms from last month, return here or emergency room if needed.  Kidney function was borderline last time, I will recheck that with blood work today. I'm also checking your cholesterol today. Follow-up in 6 months for physical. Thank you for coming in today.   IF you received an x-ray today, you will receive an invoice [facility]. Please contact [facility] at [phone] with questions or concerns regarding your invoice.   IF you received labwork today, you will receive an invoice from [facility]. Please contact [facility] at [phone] with questions or concerns regarding your invoice.   Our billing staff will not be able to assist you with questions regarding bills from these companies.  You will be contacted with the lab results as soon as they are available. The fastest way to get your results is to activate your My Chart account. Instructions are located on the last page of this paperwork. If you have not heard from us regarding the results in 2 weeks, please contact this office.

## 2017-07-29 NOTE — Progress Notes (Signed)
Subjective:    Patient ID: Dakota Bowman, male    DOB: 06/20/1970, 47 y.o.   MRN: [id number]  HPI Dakota Bowman is a 47 y.o. male Here for follow-up. Last seen 1 month ago after recent ER visit that time when he was having palpitations, chest pain. He had a normal troponin in the ER, normal CBC, TSH, EKG without acute findings. He did report significant elevated blood pressure the night prior to his symptoms with a reading of 200/112. Symptoms improved during ER visit. He was continued on Norvasc 2.5 mg daily and HCTZ 25 mg daily. Blood pressure 128/88 last visit,  EKG in office with nonspecific ST/T-wave in V2/V3. Did have some decreased sleep due to work and home responsibilities, but no other situational stressors.  He was referred to cardiology, BMP was normal except borderline creatinine at 1.29. Plasma metanephrines were normal.  Palpitations resolved, has been feeling well. Saw cardiology and was told there were no concerns. Planning for stress test and echo. Home BP around 130/80's.   BP Readings from Last 3 Encounters:  07/29/17 130/88  06/27/17 128/88  06/23/17 (!) 142/103   Hyperlipidemia: Mild elevation prior, no meds. Exercise 3 days per week.  Lab Results  Component Value Date   CHOL 198 02/14/2017   HDL 65 02/14/2017   LDLCALC 117 (H) 02/14/2017   TRIG 80 02/14/2017   CHOLHDL 3.0 02/14/2017    Lab Results  Component Value Date   ALT 16 02/14/2017   AST 24 02/14/2017   ALKPHOS 62 02/14/2017   BILITOT <0.2 02/14/2017      Patient Active Problem List   Diagnosis Date Noted  . Essential hypertension 02/09/2015  . Hyperlipidemia 02/09/2015  . Health care maintenance 10/20/2011  . Tinea pedis 10/20/2011   Past Medical History:  Diagnosis Date  . Allergy   . Essential hypertension 02/09/2015  . Hyperlipidemia 02/09/2015   Past Surgical History:  Procedure Laterality Date  . NASAL SINUS SURGERY  2005   No Known Allergies Prior to Admission medications    Medication Sig Start Date End Date Taking? Authorizing Provider  amLODipine (NORVASC) 2.5 MG tablet Take 1 tablet (2.5 mg total) by mouth daily. 02/14/17  Yes Shade FloodGreene, Mickel Schreur R, MD  hydrochlorothiazide (HYDRODIURIL) 25 MG tablet Take 1 tablet (25 mg total) by mouth daily. 02/14/17  Yes Shade FloodGreene, Valgene Deloatch R, MD  triamcinolone cream (KENALOG) 0.1 % Apply 1 application topically 2 (two) times daily. 02/14/17  Yes Shade FloodGreene, Mekesha Solomon R, MD   Social History   Socioeconomic History  . Marital status: Married    Spouse name: Not on file  . Number of children: Not on file  . Years of education: Not on file  . Highest education level: Not on file  Social Needs  . Financial resource strain: Not on file  . Food insecurity - worry: Not on file  . Food insecurity - inability: Not on file  . Transportation needs - medical: Not on file  . Transportation needs - non-medical: Not on file  Occupational History  . Not on file  Tobacco Use  . Smoking status: Never Smoker  . Smokeless tobacco: Never Used  Substance and Sexual Activity  . Alcohol use: No    Alcohol/week: 0.0 oz  . Drug use: No  . Sexual activity: Yes  Other Topics Concern  . Not on file  Social History Narrative  . Not on file    Review of Systems  Constitutional: Negative for fatigue and unexpected weight change.  Eyes: Negative for visual disturbance.  Respiratory: Negative for cough, chest tightness and shortness of breath.   Cardiovascular: Negative for chest pain, palpitations and leg swelling.  Gastrointestinal: Negative for abdominal pain and blood in stool.  Neurological: Positive for headaches (rare, no recent increase or changes. ). Negative for dizziness and light-headedness.       Objective:   Physical Exam  Constitutional: He is oriented to person, place, and time. He appears well-developed and well-nourished.  HENT:  Head: Normocephalic and atraumatic.  Eyes: EOM are normal. Pupils are equal, round, and reactive to  light.  Neck: No JVD present. Carotid bruit is not present.  Cardiovascular: Normal rate, regular rhythm and normal heart sounds.  No murmur heard. Pulmonary/Chest: Effort normal and breath sounds normal. He has no rales.  Musculoskeletal: He exhibits no edema.  Neurological: He is alert and oriented to person, place, and time.  Skin: Skin is warm and dry.  Psychiatric: He has a normal mood and affect.  Vitals reviewed.  Vitals:   07/29/17 0755  BP: 130/88  Pulse: 62  Temp: 97.6 F (36.4 C)  TempSrc: Oral  SpO2: 96%  Weight: 178 lb (80.7 kg)  Height: 5\' 8"  (1.727 m)       Assessment & Plan:  [name] is a 47 y.o. male Essential hypertension - Plan: Comprehensive metabolic panel  -Controlled, no change in meds.   Pure hypercholesterolemia - Plan: Comprehensive metabolic panel, Lipid panel  -Mildly elevated when last checked approximately 6 months ago. Has tried to control with diet and exercise, repeat lipids as fasting today  Elevated serum creatinine - Plan: Comprehensive metabolic panel  -Borderline at last visit, we'll recheck with labs today  Palpitations  -Denies further recurrence of palpitations symptoms or significant elevated blood pressure readings as discussed last month. Currently under evaluation with cardiology but overall initially reassuring report. RTC/ER precautions if symptoms recur  No orders of the defined types were placed in this encounter.  Patient Instructions   I'm glad to hear that your heart palpitations have resolved. Blood pressure looks okay today, I would not recommend any changes. Continue follow-up for the testing that was recommended by cardiology. If you have any return of heart palpitations or similar symptoms from last month, return here or emergency room if needed.  Kidney function was borderline last time, I will recheck that with blood work today. I'm also checking your cholesterol today. Follow-up in 6 months for physical.  Thank you for coming in today.   IF you received an x-ray today, you will receive an invoice from [facility]. Please contact [facility] at [phone] with questions or concerns regarding your invoice.   IF you received labwork today, you will receive an invoice from [facility]. Please contact [facility] at [phone] with questions or concerns regarding your invoice.   Our billing staff will not be able to assist you with questions regarding bills from these companies.  You will be contacted with the lab results as soon as they are available. The fastest way to get your results is to activate your My Chart account. Instructions are located on the last page of this paperwork. If you have not heard from [place] regarding the results in 2 weeks, please contact this office.      Signed,   Meredith Staggers, MD [facility] at [facility].  07/29/17 8:28 AM

## 2017-07-30 LAB — COMPREHENSIVE METABOLIC PANEL
ALK PHOS: 56 IU/L (ref 39–117)
ALT: 22 IU/L (ref 0–44)
AST: 26 IU/L (ref 0–40)
Albumin/Globulin Ratio: 1.6 (ref 1.2–2.2)
Albumin: 4.6 g/dL (ref 3.5–5.5)
BUN/Creatinine Ratio: 9 (ref 9–20)
BUN: 10 mg/dL (ref 6–24)
Bilirubin Total: 0.2 mg/dL (ref 0.0–1.2)
CHLORIDE: 101 mmol/L (ref 96–106)
CO2: 27 mmol/L (ref 20–29)
CREATININE: 1.15 mg/dL (ref 0.76–1.27)
Calcium: 9.6 mg/dL (ref 8.7–10.2)
GFR calc Af Amer: 87 mL/min/{1.73_m2} (ref >59)
GFR calc non Af Amer: 75 mL/min/{1.73_m2} (ref >59)
GLUCOSE: 93 mg/dL (ref 65–99)
Globulin, Total: 2.9 g/dL (ref 1.5–4.5)
Potassium: 4.4 mmol/L (ref 3.5–5.2)
SODIUM: 141 mmol/L (ref 134–144)
Total Protein: 7.5 g/dL (ref 6.0–8.5)

## 2017-07-30 LAB — LIPID PANEL
Chol/HDL Ratio: 3.8 ratio (ref 0.0–5.0)
Cholesterol, Total: 234 mg/dL — ABNORMAL HIGH (ref 100–199)
HDL: 62 mg/dL (ref >39)
LDL Calculated: 160 mg/dL — ABNORMAL HIGH (ref 0–99)
TRIGLYCERIDES: 61 mg/dL (ref 0–149)
VLDL CHOLESTEROL CAL: 12 mg/dL (ref 5–40)

## 2017-08-14 ENCOUNTER — Encounter: Payer: Self-pay | Admitting: *Deleted

## 2017-12-29 ENCOUNTER — Telehealth: Payer: Self-pay | Admitting: Family Medicine

## 2018-01-29 ENCOUNTER — Encounter: Payer: Managed Care, Other (non HMO) | Admitting: Family Medicine

## 2018-02-19 ENCOUNTER — Encounter: Payer: Managed Care, Other (non HMO) | Admitting: Family Medicine

## 2018-02-20 ENCOUNTER — Other Ambulatory Visit: Payer: Self-pay | Admitting: Family Medicine

## 2018-02-20 DIAGNOSIS — I10 Essential (primary) hypertension: Secondary | ICD-10-CM

## 2018-02-21 NOTE — Telephone Encounter (Signed)
Requested Prescriptions  Pending Prescriptions Disp Refills  . amLODipine (NORVASC) 2.5 MG tablet [Pharmacy Med Name: AMLODIPINE BESYLATE 2.5MG  TABLETS] 90 tablet 0    Sig: TAKE 1 TABLET(2.5 MG) BY MOUTH DAILY     Cardiovascular:  Calcium Channel Blockers Failed - 02/20/2018  6:22 PM      Failed - Valid encounter within last 6 months    Recent Outpatient Visits          6 months ago Essential hypertension   [facility], Asencion Partridge, MD   7 months ago Palpitations   [facility], Asencion Partridge, MD   1 year ago Annual physical exam   [facility], Asencion Partridge, MD   1 year ago Hand dermatitis   [facility], Asencion Partridge, MD   2 years ago Annual physical exam   [facility], Asencion Partridge, MD      Future Appointments            In 1 week Shade Flood, MD [facility] at [facility], [facility]           Passed - Last BP in normal range    BP Readings from Last 1 Encounters:  07/29/17 130/88       . hydrochlorothiazide (HYDRODIURIL) 25 MG tablet [Pharmacy Med Name: HYDROCHLOROTHIAZIDE 25MG  TABLETS] 90 tablet 0    Sig: TAKE 1 TABLET BY MOUTH DAILY     Cardiovascular: Diuretics - Thiazide Failed - 02/20/2018  6:22 PM      Failed - Valid encounter within last 6 months    Recent Outpatient Visits          6 months ago Essential hypertension   [facility], Asencion Partridge, MD   7 months ago Palpitations   [facility], Asencion Partridge, MD   1 year ago Annual physical exam   [facility], Asencion Partridge, MD   1 year ago Hand dermatitis   [facility], Asencion Partridge, MD   2 years ago Annual physical exam   [facility], Asencion Partridge, MD      Future Appointments            In 1 week Shade Flood, MD [facility] at [place], [facility]           Passed - Ca in normal range and within 360 days    Calcium  Date Value Ref Range Status  07/29/2017 9.6  8.7 - 10.2 mg/dL Final         Passed - Cr in normal range and within 360 days    Creat  Date Value Ref Range Status  02/09/2016 1.16 0.60 - 1.35 mg/dL Final   Creatinine, Ser  Date Value Ref Range Status  07/29/2017 1.15 0.76 - 1.27 mg/dL Final         Passed - K in normal range and within 360 days    Potassium  Date Value Ref Range Status  07/29/2017 4.4 3.5 - 5.2 mmol/L Final         Passed - Na in normal range and within 360 days    Sodium  Date Value Ref Range Status  07/29/2017 141 134 - 144 mmol/L Final         Passed - Last BP in normal range    BP Readings from Last 1 Encounters:  07/29/17 130/88

## 2018-03-03 ENCOUNTER — Ambulatory Visit (INDEPENDENT_AMBULATORY_CARE_PROVIDER_SITE_OTHER): Payer: Managed Care, Other (non HMO) | Admitting: Family Medicine

## 2018-03-03 ENCOUNTER — Encounter: Payer: Self-pay | Admitting: Family Medicine

## 2018-03-03 ENCOUNTER — Other Ambulatory Visit: Payer: Self-pay

## 2018-03-03 VITALS — BP 132/90 | HR 62 | Temp 97.3°F | Resp 16 | Ht 68.0 in | Wt 175.0 lb

## 2018-03-03 DIAGNOSIS — Z23 Encounter for immunization: Secondary | ICD-10-CM

## 2018-03-03 DIAGNOSIS — I10 Essential (primary) hypertension: Secondary | ICD-10-CM

## 2018-03-03 DIAGNOSIS — E785 Hyperlipidemia, unspecified: Secondary | ICD-10-CM

## 2018-03-03 DIAGNOSIS — Z0000 Encounter for general adult medical examination without abnormal findings: Secondary | ICD-10-CM

## 2018-03-03 MED ORDER — HYDROCHLOROTHIAZIDE 25 MG PO TABS: 25.0000 mg | ORAL_TABLET | Freq: Every day | ORAL | 1 refills | Status: DC

## 2018-03-03 MED ORDER — AMLODIPINE BESYLATE 2.5 MG PO TABS: ORAL_TABLET | ORAL | 1 refills | Status: DC

## 2018-03-03 NOTE — Progress Notes (Addendum)
Subjective:    Patient ID: Dakota Bowman, male    DOB: 08-Feb-1971, 47 y.o.   MRN: [id number]  HPI Dakota Bowman is a 47 y.o. male Presents today for: Chief Complaint  Patient presents with  . Annual Exam    with lab work; patient is fasting  . Flu Vaccine   Here for annual exam.  History of hypertension, hyperlipidemia.   Hypertension: BP Readings from Last 3 Encounters:  03/03/18 132/90  07/29/17 130/88  06/27/17 128/88   Lab Results  Component Value Date   CREATININE 1.15 07/29/2017  He takes amlodipine 2.5 mg daily, hydrochlorothiazide 25 mg daily.Home readings 100/70-80's. No new side effects.   Hyperlipidemia: Elevated in March, but borderline ASCVD risk, plan for diet, activity/exercise with repeat testing in 3 months at that time.  He is fasting today. Exercising more. Has cut back on sweets since last OV.  Lab Results  Component Value Date   CHOL 234 (H) 07/29/2017   HDL 62 07/29/2017   LDLCALC 160 (H) 07/29/2017   TRIG 61 07/29/2017   CHOLHDL 3.8 07/29/2017   Lab Results  Component Value Date   ALT 22 07/29/2017   AST 26 07/29/2017   ALKPHOS 56 07/29/2017   BILITOT 0.2 07/29/2017   The 10-year ASCVD risk score Mikey Bussing DC Jr., et al., 2013) is: 8.3%   Values used to calculate the score:     Age: 39 years     Sex: Male     Is Non-Hispanic African American: Yes     Diabetic: No     Tobacco smoker: No     Systolic Blood Pressure: 355 mmHg     Is BP treated: Yes     HDL Cholesterol: 62 mg/dL     Total Cholesterol: 234 mg/dL  Cancer screening: No FH of cancer known.  PSA 0.3 01/2017. Would like to delay testing again until next year.   Immunizations: Immunization History  Administered Date(s) Administered  . Influenza,inj,Quad PF,6+ Mos 02/09/2016, 02/14/2017, 03/03/2018  . Influenza-Unspecified 03/24/2015  . Tdap 10/20/2011   Depression screening: Depression screen [facility] 2/9 03/03/2018 07/29/2017 06/27/2017 02/14/2017 06/14/2016  Decreased Interest 0  0 0 0 0  Down, Depressed, Hopeless 0 0 0 0 0  PHQ - 2 Score 0 0 0 0 0   Vision screen:  Visual Acuity Screening   Right eye Left eye Both eyes  Without correction:     With correction: 20/13-1 20/13-1 20/13-1  sees optho once per year.   Dentist: Seen by dentist off [address], every 3 months.   Exercise: Wt Readings from Last 3 Encounters:  03/03/18 175 lb (79.4 kg)  07/29/17 178 lb (80.7 kg)  06/27/17 178 lb (80.7 kg)  exercising 2-3 days per week. More exercise than last visit.   Patient Active Problem List   Diagnosis Date Noted  . Essential hypertension 02/09/2015  . Hyperlipidemia 02/09/2015  . Health care maintenance 10/20/2011  . Tinea pedis 10/20/2011   Past Medical History:  Diagnosis Date  . Allergy   . Essential hypertension 02/09/2015  . Hyperlipidemia 02/09/2015   Past Surgical History:  Procedure Laterality Date  . NASAL SINUS SURGERY  2005   No Known Allergies Prior to Admission medications   Medication Sig Start Date End Date Taking? Authorizing Provider  amLODipine (NORVASC) 2.5 MG tablet TAKE 1 TABLET(2.5 MG) BY MOUTH DAILY 02/21/18  Yes Wendie Agreste, MD  hydrochlorothiazide (HYDRODIURIL) 25 MG tablet TAKE 1 TABLET BY MOUTH DAILY 02/21/18  Yes Carlota Raspberry,  Ranell Patrick, MD   Social History   Socioeconomic History  . Marital status: Married    Spouse name: Not on file  . Number of children: Not on file  . Years of education: Not on file  . Highest education level: Not on file  Occupational History  . Not on file  Social Needs  . Financial resource strain: Not on file  . Food insecurity:    Worry: Not on file    Inability: Not on file  . Transportation needs:    Medical: Not on file    Non-medical: Not on file  Tobacco Use  . Smoking status: Never Smoker  . Smokeless tobacco: Never Used  Substance and Sexual Activity  . Alcohol use: No    Alcohol/week: 0.0 standard drinks  . Drug use: No  . Sexual activity: Yes  Lifestyle  . Physical  activity:    Days per week: Not on file    Minutes per session: Not on file  . Stress: Not on file  Relationships  . Social connections:    Talks on phone: Not on file    Gets together: Not on file    Attends religious service: Not on file    Active member of club or organization: Not on file    Attends meetings of clubs or organizations: Not on file    Relationship status: Not on file  . Intimate partner violence:    Fear of current or ex partner: Not on file    Emotionally abused: Not on file    Physically abused: Not on file    Forced sexual activity: Not on file  Other Topics Concern  . Not on file  Social History Narrative  . Not on file    Review of Systems  Constitutional: Negative for fatigue and unexpected weight change.  Eyes: Negative for visual disturbance.  Respiratory: Negative for cough, chest tightness and shortness of breath.   Cardiovascular: Negative for chest pain, palpitations and leg swelling.  Gastrointestinal: Negative for abdominal pain and blood in stool.  Neurological: Negative for dizziness, light-headedness and headaches.   13 point review of systems per patient health survey also noted.  Negative other than as above.     Objective:   Physical Exam  Constitutional: He is oriented to person, place, and time. He appears well-developed and well-nourished.  HENT:  Head: Normocephalic and atraumatic.  Right Ear: External ear normal.  Left Ear: External ear normal.  Mouth/Throat: Oropharynx is clear and moist.  Eyes: Pupils are equal, round, and reactive to light. Conjunctivae and EOM are normal.  Neck: Normal range of motion. Neck supple. No thyromegaly present.  Cardiovascular: Normal rate, regular rhythm, normal heart sounds and intact distal pulses.  Pulmonary/Chest: Effort normal and breath sounds normal. No respiratory distress. He has no wheezes.  Abdominal: Soft. He exhibits no distension. There is no tenderness.  Musculoskeletal: Normal  range of motion. He exhibits no edema or tenderness.  Lymphadenopathy:    He has no cervical adenopathy.  Neurological: He is alert and oriented to person, place, and time. He has normal reflexes.  Skin: Skin is warm and dry.  Psychiatric: He has a normal mood and affect. His behavior is normal.  Vitals reviewed.  Vitals:   03/03/18 0815  BP: 132/90  Pulse: 62  Resp: 16  Temp: (!) 97.3 F (36.3 C)  TempSrc: Oral  SpO2: 99%  Weight: 175 lb (79.4 kg)  Height: 5' 8"  (1.727 m)  Assessment & Plan:   Dakota Bowman is a 47 y.o. male Annual physical exam  - -anticipatory guidance as below in AVS, screening labs above. Health maintenance items as above in HPI discussed/recommended as applicable.   - screening lipids, borderline ascvd risk prior.   Flu vaccine need - Plan: Flu Vaccine QUAD 36+ mos IM  Essential hypertension - Plan: amLODipine (NORVASC) 2.5 MG tablet, hydrochlorothiazide (HYDRODIURIL) 25 MG tablet  - stable. No med changes. Labs pending, including recheck lipids.    No orders of the defined types were placed in this encounter.  Patient Instructions    Influenza Virus Vaccine (Flucelvax) What is this medicine? INFLUENZA VIRUS VACCINE (in floo EN zuh VAHY ruhs vak SEEN) helps to reduce the risk of getting influenza also known as the flu. The vaccine only helps protect you against some strains of the flu. This medicine may be used for other purposes; ask your health care provider or pharmacist if you have questions. COMMON BRAND NAME(S): FLUCELVAX What should I tell my health care provider before I take this medicine? They need to know if you have any of these conditions: -bleeding disorder like hemophilia -fever or infection -Guillain-Barre syndrome or other neurological problems -immune system problems -infection with the human immunodeficiency virus (HIV) or AIDS -low blood platelet counts -multiple sclerosis -an unusual or allergic reaction to  influenza virus vaccine, other medicines, foods, dyes or preservatives -pregnant or trying to get pregnant -breast-feeding How should I use this medicine? This vaccine is for injection into a muscle. It is given by a health care professional. A copy of Vaccine Information Statements will be given before each vaccination. Read this sheet carefully each time. The sheet may change frequently. Talk to your pediatrician regarding the use of this medicine in children. Special care may be needed. Overdosage: If you think you've taken too much of this medicine contact a poison control center or emergency room at once. Overdosage: If you think you have taken too much of this medicine contact a poison control center or emergency room at once. NOTE: This medicine is only for you. Do not share this medicine with others. What if I miss a dose? This does not apply. What may interact with this medicine? -chemotherapy or radiation therapy -medicines that lower your immune system like etanercept, anakinra, infliximab, and adalimumab -medicines that treat or prevent blood clots like warfarin -phenytoin -steroid medicines like prednisone or cortisone -theophylline -vaccines This list may not describe all possible interactions. Give your health care provider a list of all the medicines, herbs, non-prescription drugs, or dietary supplements you use. Also tell them if you smoke, drink alcohol, or use illegal drugs. Some items may interact with your medicine. What should I watch for while using this medicine? Report any side effects that do not go away within 3 days to your doctor or health care professional. Call your health care provider if any unusual symptoms occur within 6 weeks of receiving this vaccine. You may still catch the flu, but the illness is not usually as bad. You cannot get the flu from the vaccine. The vaccine will not protect against colds or other illnesses that may cause fever. The vaccine is  needed every year. What side effects may I notice from receiving this medicine? Side effects that you should report to your doctor or health care professional as soon as possible: -allergic reactions like skin rash, itching or hives, swelling of the face, lips, or tongue Side effects that usually do not  require medical attention (Report these to your doctor or health care professional if they continue or are bothersome.): -fever -headache -muscle aches and pains -pain, tenderness, redness, or swelling at the injection site -tiredness This list may not describe all possible side effects. Call your doctor for medical advice about side effects. You may report side effects to FDA at 1-800-FDA-1088. Where should I keep my medicine? The vaccine will be given by a health care professional in a clinic, pharmacy, doctor's office, or other health care setting. You will not be given vaccine doses to store at home. NOTE: This sheet is a summary. It may not cover all possible information. If you have questions about this medicine, talk to your doctor, pharmacist, or health care provider.  2018 Elsevier/Gold Standard (2011-04-20 14:06:47)   Preventive Care 40-64 Years, Male Preventive care refers to lifestyle choices and visits with your health care provider that can promote health and wellness. What does preventive care include?  A yearly physical exam. This is also called an annual well check.  Dental exams once or twice a year.  Routine eye exams. Ask your health care provider how often you should have your eyes checked.  Personal lifestyle choices, including: ? Daily care of your teeth and gums. ? Regular physical activity. ? Eating a healthy diet. ? Avoiding tobacco and drug use. ? Limiting alcohol use. ? Practicing safe sex. ? Taking low-dose aspirin every day starting at age 4. What happens during an annual well check? The services and screenings done by your health care provider  during your annual well check will depend on your age, overall health, lifestyle risk factors, and family history of disease. Counseling Your health care provider may ask you questions about your:  Alcohol use.  Tobacco use.  Drug use.  Emotional well-being.  Home and relationship well-being.  Sexual activity.  Eating habits.  Work and work Statistician.  Screening You may have the following tests or measurements:  Height, weight, and BMI.  Blood pressure.  Lipid and cholesterol levels. These may be checked every 5 years, or more frequently if you are over 27 years old.  Skin check.  Lung cancer screening. You may have this screening every year starting at age 66 if you have a 30-pack-year history of smoking and currently smoke or have quit within the past 15 years.  Fecal occult blood test (FOBT) of the stool. You may have this test every year starting at age 59.  Flexible sigmoidoscopy or colonoscopy. You may have a sigmoidoscopy every 5 years or a colonoscopy every 10 years starting at age 66.  Prostate cancer screening. Recommendations will vary depending on your family history and other risks.  Hepatitis C blood test.  Hepatitis B blood test.  Sexually transmitted disease (STD) testing.  Diabetes screening. This is done by checking your blood sugar (glucose) after you have not eaten for a while (fasting). You may have this done every 1-3 years.  Discuss your test results, treatment options, and if necessary, the need for more tests with your health care provider. Vaccines Your health care provider may recommend certain vaccines, such as:  Influenza vaccine. This is recommended every year.  Tetanus, diphtheria, and acellular pertussis (Tdap, Td) vaccine. You may need a Td booster every 10 years.  Varicella vaccine. You may need this if you have not been vaccinated.  Zoster vaccine. You may need this after age 67.  Measles, mumps, and rubella (MMR)  vaccine. You may need at least one  dose of MMR if you were born in 1957 or later. You may also need a second dose.  Pneumococcal 13-valent conjugate (PCV13) vaccine. You may need this if you have certain conditions and have not been vaccinated.  Pneumococcal polysaccharide (PPSV23) vaccine. You may need one or two doses if you smoke cigarettes or if you have certain conditions.  Meningococcal vaccine. You may need this if you have certain conditions.  Hepatitis A vaccine. You may need this if you have certain conditions or if you travel or work in places where you may be exposed to hepatitis A.  Hepatitis B vaccine. You may need this if you have certain conditions or if you travel or work in places where you may be exposed to hepatitis B.  Haemophilus influenzae type b (Hib) vaccine. You may need this if you have certain risk factors.  Talk to your health care provider about which screenings and vaccines you need and how often you need them. This information is not intended to replace advice given to you by your health care provider. Make sure you discuss any questions you have with your health care provider. Document Released: 06/05/2015 Document Revised: 01/27/2016 Document Reviewed: 03/10/2015 Elsevier Interactive Patient Education  [employer].   If you have lab work done today you will be contacted with your lab results within the next 2 weeks.  If you have not heard from [place] then please contact us. The fastest way to get your results is to register for My Chart.   IF you received an x-ray today, you will receive an invoice from [facility]. Please contact [facility] at [phone] with questions or concerns regarding your invoice.   IF you received labwork today, you will receive an invoice from [place]. Please contact [facility] at [phone] with questions or concerns regarding your invoice.   Our billing staff will not be able to assist you with  questions regarding bills from these companies.  You will be contacted with the lab results as soon as they are available. The fastest way to get your results is to activate your My Chart account. Instructions are located on the last page of this paperwork. If you have not heard from [place] regarding the results in 2 weeks, please contact this office.      Signed,   Merri Ray, MD [facility] [place].  03/03/18 9:24 AM

## 2018-03-03 NOTE — Patient Instructions (Addendum)
No change in meds. I will let you know if any new meds needed after labs. Recheck in 6 months. Thanks for coming in today.   Keeping you healthy  Get these tests  Blood pressure- Have your blood pressure checked once a year by your healthcare provider.  Normal blood pressure is 120/80.  Weight- Have your body mass index (BMI) calculated to screen for obesity.  BMI is a measure of body fat based on height and weight. You can also calculate your own BMI at [employer]velBags.it.  Cholesterol- Have your cholesterol checked regularly starting at age 78, sooner may be necessary if you have diabetes, high blood pressure, if a family member developed heart diseases at an early age or if you smoke.   Chlamydia, HIV, and other sexual transmitted disease- Get screened each year until the age of 34 then within three months of each new sexual partner.  Diabetes- Have your blood sugar checked regularly if you have high blood pressure, high cholesterol, a family history of diabetes or if you are overweight.  Get these vaccines  Flu shot- Every fall.  Tetanus shot- Every 10 years.  Menactra- Single dose; prevents meningitis.  Take these steps  Don't smoke- If you do smoke, ask your healthcare provider about quitting. For tips on how to quit, go to www.smokefree.gov or call 1-800-QUIT-NOW.  Be physically active- Exercise 5 days a week for at least 30 minutes.  If you are not already physically active start slow and gradually work up to 30 minutes of moderate physical activity.  Examples of moderate activity include walking briskly, mowing the yard, dancing, swimming bicycling, etc.  Eat a healthy diet- Eat a variety of healthy foods such as fruits, vegetables, low fat milk, low fat cheese, yogurt, lean meats, poultry, fish, beans, tofu, etc.  For more information on healthy eating, go to www.thenutritionsource.org  Drink alcohol in moderation- Limit alcohol intake two drinks or less a day.  Never  drink and drive.  Dentist- Brush and floss teeth twice daily; visit your dentis twice a year.  Depression-Your emotional health is as important as your physical health.  If you're feeling down, losing interest in things you normally enjoy please talk with your healthcare provider.  Gun Safety- If you keep a gun in your home, keep it unloaded and with the safety lock on.  Bullets should be stored separately.  Helmet use- Always wear a helmet when riding a motorcycle, bicycle, rollerblading or skateboarding.  Safe sex- If you may be exposed to a sexually transmitted infection, use a condom  Seat belts- Seat bels can save your life; always wear one.  Smoke/Carbon Monoxide detectors- These detectors need to be installed on the appropriate level of your home.  Replace batteries at least once a year.  Skin Cancer- When out in the sun, cover up and use sunscreen SPF 15 or higher.  Violence- If anyone is threatening or hurting you, please tell your healthcare provider. Influenza Virus Vaccine (Flucelvax) What is this medicine? INFLUENZA VIRUS VACCINE (in floo EN zuh VAHY ruhs vak SEEN) helps to reduce the risk of getting influenza also known as the flu. The vaccine only helps protect you against some strains of the flu. This medicine may be used for other purposes; ask your health care provider or pharmacist if you have questions. COMMON BRAND NAME(S): FLUCELVAX What should I tell my health care provider before I take this medicine? They need to know if you have any of these conditions: -bleeding disorder  like hemophilia -fever or infection -Guillain-Barre syndrome or other neurological problems -immune system problems -infection with the human immunodeficiency virus (HIV) or AIDS -low blood platelet counts -multiple sclerosis -an unusual or allergic reaction to influenza virus vaccine, other medicines, foods, dyes or preservatives -pregnant or trying to get pregnant -breast-feeding How  should I use this medicine? This vaccine is for injection into a muscle. It is given by a health care professional. A copy of Vaccine Information Statements will be given before each vaccination. Read this sheet carefully each time. The sheet may change frequently. Talk to your pediatrician regarding the use of this medicine in children. Special care may be needed. Overdosage: If you think you've taken too much of this medicine contact a poison control center or emergency room at once. Overdosage: If you think you have taken too much of this medicine contact a poison control center or emergency room at once. NOTE: This medicine is only for you. Do not share this medicine with others. What if I miss a dose? This does not apply. What may interact with this medicine? -chemotherapy or radiation therapy -medicines that lower your immune system like etanercept, anakinra, infliximab, and adalimumab -medicines that treat or prevent blood clots like warfarin -phenytoin -steroid medicines like prednisone or cortisone -theophylline -vaccines This list may not describe all possible interactions. Give your health care provider a list of all the medicines, herbs, non-prescription drugs, or dietary supplements you use. Also tell them if you smoke, drink alcohol, or use illegal drugs. Some items may interact with your medicine. What should I watch for while using this medicine? Report any side effects that do not go away within 3 days to your doctor or health care professional. Call your health care provider if any unusual symptoms occur within 6 weeks of receiving this vaccine. You may still catch the flu, but the illness is not usually as bad. You cannot get the flu from the vaccine. The vaccine will not protect against colds or other illnesses that may cause fever. The vaccine is needed every year. What side effects may I notice from receiving this medicine? Side effects that you should report to your  doctor or health care professional as soon as possible: -allergic reactions like skin rash, itching or hives, swelling of the face, lips, or tongue Side effects that usually do not require medical attention (Report these to your doctor or health care professional if they continue or are bothersome.): -fever -headache -muscle aches and pains -pain, tenderness, redness, or swelling at the injection site -tiredness This list may not describe all possible side effects. Call your doctor for medical advice about side effects. You may report side effects to FDA at 1-800-FDA-1088. Where should I keep my medicine? The vaccine will be given by a health care professional in a clinic, pharmacy, doctor's office, or other health care setting. You will not be given vaccine doses to store at home. NOTE: This sheet is a summary. It may not cover all possible information. If you have questions about this medicine, talk to your doctor, pharmacist, or health care provider.  2018 Elsevier/Gold Standard (2011-04-20 14:06:47)   Preventive Care 40-64 Years, Male Preventive care refers to lifestyle choices and visits with your health care provider that can promote health and wellness. What does preventive care include?  A yearly physical exam. This is also called an annual well check.  Dental exams once or twice a year.  Routine eye exams. Ask your health care provider how often  you should have your eyes checked.  Personal lifestyle choices, including: ? Daily care of your teeth and gums. ? Regular physical activity. ? Eating a healthy diet. ? Avoiding tobacco and drug use. ? Limiting alcohol use. ? Practicing safe sex. ? Taking low-dose aspirin every day starting at age 15. What happens during an annual well check? The services and screenings done by your health care provider during your annual well check will depend on your age, overall health, lifestyle risk factors, and family history of  disease. Counseling Your health care provider may ask you questions about your:  Alcohol use.  Tobacco use.  Drug use.  Emotional well-being.  Home and relationship well-being.  Sexual activity.  Eating habits.  Work and work Statistician.  Screening You may have the following tests or measurements:  Height, weight, and BMI.  Blood pressure.  Lipid and cholesterol levels. These may be checked every 5 years, or more frequently if you are over 16 years old.  Skin check.  Lung cancer screening. You may have this screening every year starting at age 36 if you have a 30-pack-year history of smoking and currently smoke or have quit within the past 15 years.  Fecal occult blood test (FOBT) of the stool. You may have this test every year starting at age 16.  Flexible sigmoidoscopy or colonoscopy. You may have a sigmoidoscopy every 5 years or a colonoscopy every 10 years starting at age 67.  Prostate cancer screening. Recommendations will vary depending on your family history and other risks.  Hepatitis C blood test.  Hepatitis B blood test.  Sexually transmitted disease (STD) testing.  Diabetes screening. This is done by checking your blood sugar (glucose) after you have not eaten for a while (fasting). You may have this done every 1-3 years.  Discuss your test results, treatment options, and if necessary, the need for more tests with your health care provider. Vaccines Your health care provider may recommend certain vaccines, such as:  Influenza vaccine. This is recommended every year.  Tetanus, diphtheria, and acellular pertussis (Tdap, Td) vaccine. You may need a Td booster every 10 years.  Varicella vaccine. You may need this if you have not been vaccinated.  Zoster vaccine. You may need this after age 41.  Measles, mumps, and rubella (MMR) vaccine. You may need at least one dose of MMR if you were born in 1957 or later. You may also need a second  dose.  Pneumococcal 13-valent conjugate (PCV13) vaccine. You may need this if you have certain conditions and have not been vaccinated.  Pneumococcal polysaccharide (PPSV23) vaccine. You may need one or two doses if you smoke cigarettes or if you have certain conditions.  Meningococcal vaccine. You may need this if you have certain conditions.  Hepatitis A vaccine. You may need this if you have certain conditions or if you travel or work in places where you may be exposed to hepatitis A.  Hepatitis B vaccine. You may need this if you have certain conditions or if you travel or work in places where you may be exposed to hepatitis B.  Haemophilus influenzae type b (Hib) vaccine. You may need this if you have certain risk factors.  Talk to your health care provider about which screenings and vaccines you need and how often you need them. This information is not intended to replace advice given to you by your health care provider. Make sure you discuss any questions you have with your health care provider. Document  Released: 06/05/2015 Document Revised: 01/27/2016 Document Reviewed: 03/10/2015 Elsevier Interactive Patient Education  [employer].   If you have lab work done today you will be contacted with your lab results within the next 2 weeks.  If you have not heard from [place] then please contact us. The fastest way to get your results is to register for My Chart.   IF you received an x-ray today, you will receive an invoice from [facility]. Please contact [facility] at [phone] with questions or concerns regarding your invoice.   IF you received labwork today, you will receive an invoice from [facility]. Please contact [facility] at [phone] with questions or concerns regarding your invoice.   Our billing staff will not be able to assist you with questions regarding bills from these companies.  You will be contacted with the lab results as soon as they are  available. The fastest way to get your results is to activate your My Chart account. Instructions are located on the last page of this paperwork. If you have not heard from [place] regarding the results in 2 weeks, please contact this office.

## 2018-03-04 LAB — LIPID PANEL
CHOL/HDL RATIO: 3.4 ratio (ref 0.0–5.0)
Cholesterol, Total: 241 mg/dL — ABNORMAL HIGH (ref 100–199)
HDL: 70 mg/dL (ref >39)
LDL CALC: 158 mg/dL — AB (ref 0–99)
TRIGLYCERIDES: 67 mg/dL (ref 0–149)
VLDL CHOLESTEROL CAL: 13 mg/dL (ref 5–40)

## 2018-03-04 LAB — COMPREHENSIVE METABOLIC PANEL
A/G RATIO: 1.6 (ref 1.2–2.2)
ALT: 15 IU/L (ref 0–44)
AST: 23 IU/L (ref 0–40)
Albumin: 4.8 g/dL (ref 3.5–5.5)
Alkaline Phosphatase: 54 IU/L (ref 39–117)
BUN/Creatinine Ratio: 13 (ref 9–20)
BUN: 16 mg/dL (ref 6–24)
Bilirubin Total: 0.2 mg/dL (ref 0.0–1.2)
CALCIUM: 9.8 mg/dL (ref 8.7–10.2)
CO2: 27 mmol/L (ref 20–29)
CREATININE: 1.22 mg/dL (ref 0.76–1.27)
Chloride: 100 mmol/L (ref 96–106)
GFR, EST AFRICAN AMERICAN: 81 mL/min/{1.73_m2} (ref >59)
GFR, EST NON AFRICAN AMERICAN: 70 mL/min/{1.73_m2} (ref >59)
Globulin, Total: 3 g/dL (ref 1.5–4.5)
Glucose: 90 mg/dL (ref 65–99)
Potassium: 4.2 mmol/L (ref 3.5–5.2)
Sodium: 142 mmol/L (ref 134–144)
TOTAL PROTEIN: 7.8 g/dL (ref 6.0–8.5)

## 2018-03-06 ENCOUNTER — Encounter: Payer: Self-pay | Admitting: *Deleted

## 2018-03-15 NOTE — Telephone Encounter (Signed)
done

## 2018-08-29 ENCOUNTER — Telehealth: Payer: Self-pay | Admitting: Family Medicine

## 2018-08-29 NOTE — Telephone Encounter (Signed)
Copied from CRM [id number]. Topic: Referral - Request for Referral >> Aug 29, 2018  4:31 PM Wyonia Hough E wrote: Has patient seen PCP for this complaint? Yes  *If NO, is insurance requiring patient see PCP for this issue before PCP can refer them? Referral for which specialty: dermatologist  Preferred provider/office: Reason for referral: skin on hands and wrist

## 2018-08-31 NOTE — Telephone Encounter (Signed)
Needs tele med or webex apt.  

## 2018-08-31 NOTE — Telephone Encounter (Signed)
Please send referral if appropriate

## 2018-08-31 NOTE — Telephone Encounter (Signed)
More information please.  Is he having a new rash as it appears we have treated him for contact dermatitis or rash of the hands back in 2018?  Potentially could do telemedicine visit with WebEx and treat current rash as I am not sure when he would be able to see dermatology.

## 2018-09-04 ENCOUNTER — Other Ambulatory Visit: Payer: Self-pay

## 2018-09-04 DIAGNOSIS — E785 Hyperlipidemia, unspecified: Secondary | ICD-10-CM

## 2018-09-07 ENCOUNTER — Other Ambulatory Visit: Payer: Self-pay

## 2018-09-07 ENCOUNTER — Ambulatory Visit (INDEPENDENT_AMBULATORY_CARE_PROVIDER_SITE_OTHER): Payer: Managed Care, Other (non HMO) | Admitting: Family Medicine

## 2018-09-07 DIAGNOSIS — E785 Hyperlipidemia, unspecified: Secondary | ICD-10-CM

## 2018-09-07 DIAGNOSIS — I10 Essential (primary) hypertension: Secondary | ICD-10-CM | POA: Diagnosis not present

## 2018-09-08 LAB — LIPID PANEL
Chol/HDL Ratio: 3.4 ratio (ref 0.0–5.0)
Cholesterol, Total: 233 mg/dL — ABNORMAL HIGH (ref 100–199)
HDL: 69 mg/dL (ref >39)
LDL Calculated: 156 mg/dL — ABNORMAL HIGH (ref 0–99)
Triglycerides: 42 mg/dL (ref 0–149)
VLDL Cholesterol Cal: 8 mg/dL (ref 5–40)

## 2018-09-10 ENCOUNTER — Other Ambulatory Visit: Payer: Self-pay

## 2018-09-10 ENCOUNTER — Telehealth (INDEPENDENT_AMBULATORY_CARE_PROVIDER_SITE_OTHER): Payer: Managed Care, Other (non HMO) | Admitting: Family Medicine

## 2018-09-10 DIAGNOSIS — L309 Dermatitis, unspecified: Secondary | ICD-10-CM

## 2018-09-10 DIAGNOSIS — E785 Hyperlipidemia, unspecified: Secondary | ICD-10-CM | POA: Diagnosis not present

## 2018-09-10 DIAGNOSIS — I10 Essential (primary) hypertension: Secondary | ICD-10-CM

## 2018-09-10 MED ORDER — TRIAMCINOLONE ACETONIDE 0.5 % EX CREA: 1.0000 "application " | TOPICAL_CREAM | Freq: Two times a day (BID) | CUTANEOUS | 1 refills | Status: DC

## 2018-09-10 NOTE — Progress Notes (Signed)
CC-3 month f/u cholesterol- Patient came in office on 09/03/2018 for labs results are in the chart. Patient did not need any refill at this time. Patient would like a referral to go see Dermatology for hand rash that has been going on for years. Was seen here yr ago for the rash but now thinks need to be seen by Dermatology.

## 2018-09-10 NOTE — Progress Notes (Signed)
Virtual Visit via Video Note  I connected with Dakota Bowman on 09/10/18 at 8:53 by a video enabled telemedicine application and verified that I am speaking with the correct person using two identifiers.   I discussed the limitations, risks, security and privacy concerns of performing an evaluation and management service by telephone and the availability of in person appointments. I also discussed with the patient that there may be a patient responsible charge related to this service. The patient expressed understanding and agreed to proceed, consent obtained  Chief complaint: HLD, HTN, rash hands.    History of Present Illness: Dakota Bowman is a 48 y.o. male  Hypertension: BP Readings from Last 3 Encounters:  03/03/18 132/90  07/29/17 130/88  06/27/17 128/88   Lab Results  Component Value Date   CREATININE 1.22 03/03/2018  Currently taking amlodipine 2.5 mg daily, hydrochlorothiazide 25 mg daily.   Home blood pressures: no recent reading, but prior 120/80 No new side effects of meds.  Constitutional: Negative for fatigue and unexpected weight change.  Eyes: Negative for visual disturbance.  Respiratory: Negative for cough, chest tightness and shortness of breath.   Cardiovascular: Negative for chest pain, palpitations and leg swelling.  Gastrointestinal: Negative for abdominal pain and blood in stool.  Neurological: Negative for dizziness, light-headedness and headaches.     Hyperlipidemia: Elevated lipids with total 241, LDL 158 in October of last year.  Similar readings 1 year ago.  Option of low-dose statin given, but continued on diet/exercise approach. Lab Results  Component Value Date   CHOL 233 (H) 09/07/2018   HDL 69 09/07/2018   LDLCALC 156 (H) 09/07/2018   TRIG 42 09/07/2018   CHOLHDL 3.4 09/07/2018   Lab Results  Component Value Date   ALT 15 03/03/2018   AST 23 03/03/2018   ALKPHOS 54 03/03/2018   BILITOT 0.2 03/03/2018  not currently on statin.   Some exercise, but less with gym closed  Some walking.  The 10-year ASCVD risk score Denman George DC Montez Hageman., et al., 2013) is: 8.4%   Values used to calculate the score:     Age: 48 years     Sex: Male     Is Non-Hispanic African American: Yes     Diabetic: No     Tobacco smoker: No     Systolic Blood Pressure: 132 mmHg     Is BP treated: Yes     HDL Cholesterol: 69 mg/dL     Total Cholesterol: 233 mg/dL Would like to try diet/exercise approach, recheck 3-6 months as borderline ASCVD risk.   Rash of hands.  Discussed in 2018, thought to be a contact dermatitis from use of gloves at that time.  Had been using a cotton liner below the other gloves but also prescribed triamcinolone cream to use as needed.  Same rash as in past, but seems worse. Tried triamcinolone in past, but did not seem to work most recently. Has tried Recruitment consultant. May be worse.    Patient Active Problem List   Diagnosis Date Noted  . Essential hypertension 02/09/2015  . Hyperlipidemia 02/09/2015  . Health care maintenance 10/20/2011  . Tinea pedis 10/20/2011   Past Medical History:  Diagnosis Date  . Allergy   . Essential hypertension 02/09/2015  . Hyperlipidemia 02/09/2015   Past Surgical History:  Procedure Laterality Date  . NASAL SINUS SURGERY  2005   No Known Allergies Prior to Admission medications   Medication Sig Start Date End Date Taking? Authorizing Provider  amLODipine (NORVASC)  2.5 MG tablet TAKE 1 TABLET(2.5 MG) BY MOUTH DAILY 03/03/18  Yes Shade Flood, MD  hydrochlorothiazide (HYDRODIURIL) 25 MG tablet Take 1 tablet (25 mg total) by mouth daily. 03/03/18  Yes Shade Flood, MD   Social History   Socioeconomic History  . Marital status: Married    Spouse name: Not on file  . Number of children: Not on file  . Years of education: Not on file  . Highest education level: Not on file  Occupational History  . Not on file  Social Needs  . Financial resource strain: Not on file  .  Food insecurity:    Worry: Not on file    Inability: Not on file  . Transportation needs:    Medical: Not on file    Non-medical: Not on file  Tobacco Use  . Smoking status: Never Smoker  . Smokeless tobacco: Never Used  Substance and Sexual Activity  . Alcohol use: No    Alcohol/week: 0.0 standard drinks  . Drug use: No  . Sexual activity: Yes  Lifestyle  . Physical activity:    Days per week: Not on file    Minutes per session: Not on file  . Stress: Not on file  Relationships  . Social connections:    Talks on phone: Not on file    Gets together: Not on file    Attends religious service: Not on file    Active member of club or organization: Not on file    Attends meetings of clubs or organizations: Not on file    Relationship status: Not on file  . Intimate partner violence:    Fear of current or ex partner: Not on file    Emotionally abused: Not on file    Physically abused: Not on file    Forced sexual activity: Not on file  Other Topics Concern  . Not on file  Social History Narrative  . Not on file    Observations/Objective: Dorsum of hands: some difficulty over video but appears to have some confluent papules across dorsum hands, no vesicles apparently seen or wounds.  Palmar hands appeared normal.  No distress, speaking normally  Assessment and Plan: Hand dermatitis - Plan: Ambulatory referral to Dermatology, triamcinolone cream (KENALOG) 0.5 %  -Previously thought to be due to contact dermatitis from use of gloves.  Recent worsening reported, but has been off steroid treatment.  Did not feel that the 0.1% was sufficient previously.  Will increase triamcinolone to 0.5%, twice daily as needed, and refer to dermatology.  RTC precautions  Essential hypertension  -Stable, continue same regimen.  Hyperlipidemia, unspecified hyperlipidemia type  -Borderline ASCVD risk score, decided to continue diet exercise approach with recheck in 6 months.     Follow Up  Instructions:  6 months.    Patient Instructions    Good talking to you today.  No change in blood pressure medicines for now.  Cholesterol was borderline to recommend a statin medicine.  Watch diet, exercise and recheck levels in 6 months.  For hand rash, I did refer you to dermatology, but can try the stronger steroid cream up to twice per day to the affected area.  Use that only as needed until symptoms have improved.  If worsening, please schedule another visit.  Take care.   If you have lab work done today you will be contacted with your lab results within the next 2 weeks.  If you have not heard from [place] then please contact  us. The fastest way to get your results is to register for My Chart.   IF you received an x-ray today, you will receive an invoice [facility]. Please contact [facility] at [phone] with questions or concerns regarding your invoice.   IF you received labwork today, you will receive an invoice from [facility]. Please contact [facility] at [phone] with questions or concerns regarding your invoice.   Our billing staff will not be able to assist you with questions regarding bills from these companies.  You will be contacted with the lab results as soon as they are available. The fastest way to get your results is to activate your My Chart account. Instructions are located on the last page of this paperwork. If you have not heard from us regarding the results in 2 weeks, please contact this office.        I discussed the assessment and treatment plan with the patient. The patient was provided an opportunity to ask questions and all were answered. The patient agreed with the plan and demonstrated an understanding of the instructions.   The patient was advised to call back or seek an in-person evaluation if the symptoms worsen or if the condition fails to improve as anticipated.  I provided 13 minutes of non-face-to-face time during this  encounter.   Shade FloodJeffrey R Marsia Cino, MD

## 2018-09-10 NOTE — Patient Instructions (Addendum)
  Good talking to you today.  No change in blood pressure medicines for now.  Cholesterol was borderline to recommend a statin medicine.  Watch diet, exercise and recheck levels in 6 months.  For hand rash, I did refer you to dermatology, but can try the stronger steroid cream up to twice per day to the affected area.  Use that only as needed until symptoms have improved.  If worsening, please schedule another visit.  Take care.   If you have lab work done today you will be contacted with your lab results within the next 2 weeks.  If you have not heard from [place] then please contact us. The fastest way to get your results is to register for My Chart.   IF you received an x-ray today, you will receive an invoice from [facility]. Please contact [facility] at [phone] with questions or concerns regarding your invoice.   IF you received labwork today, you will receive an invoice from [place]. Please contact [facility] at [phone] with questions or concerns regarding your invoice.   Our billing staff will not be able to assist you with questions regarding bills from these companies.  You will be contacted with the lab results as soon as they are available. The fastest way to get your results is to activate your My Chart account. Instructions are located on the last page of this paperwork. If you have not heard from [place] regarding the results in 2 weeks, please contact this office.

## 2019-03-06 ENCOUNTER — Encounter: Payer: Self-pay | Admitting: Family Medicine

## 2019-03-06 ENCOUNTER — Ambulatory Visit (INDEPENDENT_AMBULATORY_CARE_PROVIDER_SITE_OTHER): Payer: Managed Care, Other (non HMO) | Admitting: Family Medicine

## 2019-03-06 ENCOUNTER — Other Ambulatory Visit: Payer: Self-pay

## 2019-03-06 VITALS — BP 144/80 | HR 73 | Temp 98.3°F | Wt 179.4 lb

## 2019-03-06 DIAGNOSIS — Z0001 Encounter for general adult medical examination with abnormal findings: Secondary | ICD-10-CM

## 2019-03-06 DIAGNOSIS — I10 Essential (primary) hypertension: Secondary | ICD-10-CM

## 2019-03-06 DIAGNOSIS — Z23 Encounter for immunization: Secondary | ICD-10-CM

## 2019-03-06 DIAGNOSIS — Z1329 Encounter for screening for other suspected endocrine disorder: Secondary | ICD-10-CM

## 2019-03-06 DIAGNOSIS — B353 Tinea pedis: Secondary | ICD-10-CM | POA: Diagnosis not present

## 2019-03-06 DIAGNOSIS — Z0000 Encounter for general adult medical examination without abnormal findings: Secondary | ICD-10-CM

## 2019-03-06 DIAGNOSIS — E785 Hyperlipidemia, unspecified: Secondary | ICD-10-CM | POA: Diagnosis not present

## 2019-03-06 MED ORDER — AMLODIPINE BESYLATE 5 MG PO TABS: ORAL_TABLET | ORAL | 1 refills | Status: DC

## 2019-03-06 MED ORDER — CLOTRIMAZOLE 1 % EX CREA: 1.0000 "application " | TOPICAL_CREAM | Freq: Two times a day (BID) | CUTANEOUS | 1 refills | Status: DC

## 2019-03-06 MED ORDER — HYDROCHLOROTHIAZIDE 25 MG PO TABS: 25.0000 mg | ORAL_TABLET | Freq: Every day | ORAL | 1 refills | Status: DC

## 2019-03-06 NOTE — Patient Instructions (Addendum)
Increase amlodipine to 5mg  per day, no change in hydrochlorothiazide. I will check cholesterol levels, but continue exercise most days per week.   Antifungal cream between toes twice per day.  Allow those areas to dry after showering, and use drying powder over-the-counter if needed throughout the day if your feet sweat.  Cotton socks or other breathable material is recommended.  See other information below.  Follow-up if that is not improving with treatment.   Athlete's Foot  Athlete's foot (tinea pedis) is a fungal infection of the skin on your feet. It often occurs on the skin that is between or underneath the toes. It can also occur on the soles of your feet. The infection can spread from person to person (is contagious). It can also spread when a person's bare feet come in contact with the fungus on shower floors or on items such as shoes. What are the causes? This condition is caused by a fungus that grows in warm, moist places. You can get athlete's foot by sharing shoes, shower stalls, towels, and wet floors with someone who is infected. Not washing your feet or changing your socks often enough can also lead to athlete's foot. What increases the risk? This condition is more likely to develop in:  Men.  People who have a weak body defense system (immune system).  People who have diabetes.  People who use public showers, such as at a gym.  People who wear heavy-duty shoes, such as Environmental manager.  Seasons with warm, humid weather. What are the signs or symptoms? Symptoms of this condition include:  Itchy areas between your toes or on the soles of your feet.  White, flaky, or scaly areas between your toes or on the soles of your feet.  Very itchy small blisters between your toes or on the soles of your feet.  Small cuts in your skin. These cuts can become infected.  Thick or discolored toenails. How is this diagnosed? This condition may be diagnosed with a  physical exam and a review of your medical history. Your health care provider may also take a skin or toenail sample to examine under a microscope. How is this treated? This condition is treated with antifungal medicines. These may be applied as powders, ointments, or creams. In severe cases, an oral antifungal medicine may be given. Follow these instructions at home: Medicines  Apply or take over-the-counter and prescription medicines only as told by your health care provider.  Apply your antifungal medicine as told by your health care provider. Do not stop using the antifungal even if your condition improves. Foot care  Do not scratch your feet.  Keep your feet dry: ? Wear cotton or wool socks. Change your socks every day or if they become wet. ? Wear shoes that allow air to flow, such as sandals or canvas tennis shoes.  Wash and dry your feet, including the area between your toes. Also, wash and dry your feet: ? Every day or as told by your health care provider. ? After exercising. General instructions  Do not let others use towels, shoes, nail clippers, or other personal items that touch your feet.  Protect your feet by wearing sandals in wet areas, such as locker rooms and shared showers.  Keep all follow-up visits as told by your health care provider. This is important.  If you have diabetes, keep your blood sugar under control. Contact a health care provider if:  You have a fever.  You have  swelling, soreness, warmth, or redness in your foot.  Your feet are not getting better with treatment.  Your symptoms get worse.  You have new symptoms. Summary  Athlete's foot (tinea pedis) is a fungal infection of the skin on your feet. It often occurs on skin that is between or underneath the toes.  This condition is caused by a fungus that grows in warm, moist places.  Symptoms include white, flaky, or scaly areas between your toes or on the soles of your feet.  This  condition is treated with antifungal medicines.  Keep your feet clean. Always dry them thoroughly. This information is not intended to replace advice given to you by your health care provider. Make sure you discuss any questions you have with your health care provider. Document Released: 05/06/2000 Document Revised: 05/04/2017 Document Reviewed: 02/27/2017 Elsevier Patient Education  2020 [employer]    Keeping you healthy  Get these tests  Blood pressure- Have your blood pressure checked once a year by your healthcare provider.  Normal blood pressure is 120/80.  Weight- Have your body mass index (BMI) calculated to screen for obesity.  BMI is a measure of body fat based on height and weight. You can also calculate your own BMI at https://www.west-esparza.com/www.nhlbisupport.com/bmi/.  Cholesterol- Have your cholesterol checked regularly starting at age 49, sooner may be necessary if you have diabetes, high blood pressure, if a family member developed heart diseases at an early age or if you smoke.   Chlamydia, HIV, and other sexual transmitted disease- Get screened each year until the age of 49 then within three months of each new sexual partner.  Diabetes- Have your blood sugar checked regularly if you have high blood pressure, high cholesterol, a family history of diabetes or if you are overweight.  Get these vaccines  Flu shot- Every fall.  Tetanus shot- Every 10 years.  Menactra- Single dose; prevents meningitis.  Take these steps  Don't smoke- If you do smoke, ask your healthcare provider about quitting. For tips on how to quit, go to www.smokefree.gov or call 1-800-QUIT-NOW.  Be physically active- Exercise 5 days a week for at least 30 minutes.  If you are not already physically active start slow and gradually work up to 30 minutes of moderate physical activity.  Examples of moderate activity include walking briskly, mowing the yard, dancing, swimming bicycling, etc.  Eat a healthy diet- Eat a  variety of healthy foods such as fruits, vegetables, low fat milk, low fat cheese, yogurt, lean meats, poultry, fish, beans, tofu, etc.  For more information on healthy eating, go to www.thenutritionsource.org  Drink alcohol in moderation- Limit alcohol intake two drinks or less a day.  Never drink and drive.  Dentist- Brush and floss teeth twice daily; visit your dentis twice a year.  Depression-Your emotional health is as important as your physical health.  If you're feeling down, losing interest in things you normally enjoy please talk with your healthcare provider.  Gun Safety- If you keep a gun in your home, keep it unloaded and with the safety lock on.  Bullets should be stored separately.  Helmet use- Always wear a helmet when riding a motorcycle, bicycle, rollerblading or skateboarding.  Safe sex- If you may be exposed to a sexually transmitted infection, use a condom  Seat belts- Seat bels can save your life; always wear one.  Smoke/Carbon Monoxide detectors- These detectors need to be installed on the appropriate level of your home.  Replace batteries at least once  a year.  Skin Cancer- When out in the sun, cover up and use sunscreen SPF 15 or higher.  Violence- If anyone is threatening or hurting you, please tell your healthcare provider.   If you have lab work done today you will be contacted with your lab results within the next 2 weeks.  If you have not heard from [place] then please contact us. The fastest way to get your results is to register for My Chart.   IF you received an x-ray today, you will receive an invoice from [facility]. Please contact [facility] at [phone] with questions or concerns regarding your invoice.   IF you received labwork today, you will receive an invoice from [facility]. Please contact [facility] at [phone] with questions or concerns regarding your invoice.   Our billing staff will not be able to assist you with questions  regarding bills from these companies.  You will be contacted with the lab results as soon as they are available. The fastest way to get your results is to activate your My Chart account. Instructions are located on the last page of this paperwork. If you have not heard from [place] regarding the results in 2 weeks, please contact this office.

## 2019-03-06 NOTE — Progress Notes (Signed)
Subjective:    Patient ID: Dakota Bowman, male    DOB: 03/04/1971, 48 y.o.   MRN: [id number]  HPI Dakota Bowman is a 48 y.o. male Presents today for: Chief Complaint  Patient presents with  . Annual Exam    Here for annual exam with no other issues at this time   Hypertension: BP Readings from Last 3 Encounters:  03/06/19 (!) 144/80  03/03/18 132/90  07/29/17 130/88   Lab Results  Component Value Date   CREATININE 1.22 03/03/2018  home readings: none.  No new side effects with meds - currently taking norvasc 2.5mg  qd, HCTZ 25mg  qd.  Rare HA, no weakness, no chest pains.   Hyperlipidemia:  Lab Results  Component Value Date   CHOL 233 (H) 09/07/2018   HDL 69 09/07/2018   LDLCALC 156 (H) 09/07/2018   TRIG 42 09/07/2018   CHOLHDL 3.4 09/07/2018   Lab Results  Component Value Date   ALT 15 03/03/2018   AST 23 03/03/2018   ALKPHOS 54 03/03/2018   BILITOT 0.2 03/03/2018  Statin discussed in the past but chose to try diet/exercise approach initially.  Some exercise but less in April at telemedicine visit.  Decreased exercise with gyms being closed due to pandemic.  10-year ASCVD risk score in April was 8.4%.  Walking/jogging for exercise 3-4 times per week.   The 10-year ASCVD risk score Denman George(Goff [place] Montez HagemanJr., et al., 2013) is: 9.9%   Values used to calculate the score:     Age: [age] years     Sex: Male     Is Non-Hispanic African American: Yes     Diabetic: No     Tobacco smoker: No     Systolic Blood Pressure: 144 mmHg     Is BP treated: Yes     HDL Cholesterol: 69 mg/dL     Total Cholesterol: 233 mg/dL  FH of cancer - none.    Immunization History  Administered Date(s) Administered  . Influenza,inj,Quad PF,6+ Mos 02/09/2016, 02/14/2017, 03/03/2018  . Influenza-Unspecified 03/24/2015  . Tdap 10/20/2011  Flu vaccine given today  [facility] 2[facility] 03/06/2019 09/10/2018 03/03/2018 07/29/2017 06/27/2017  Decreased Interest 0 0 0 0 0  Down, Depressed, Hopeless 0  0 0 0 0  PHQ - 2 Score 0 0 0 0 0    Hearing Screening   125Hz  250Hz  500Hz  1000Hz  2000Hz  3000Hz  4000Hz  6000Hz  8000Hz   Right ear:           Left ear:             Visual Acuity Screening   Right eye Left eye Both eyes  Without correction:     With correction: 20/13 20/13 20/13   saw optho last week.   Dental: every 6 months. appt last month.   Exercise: walking/jogging few days per week.   Hand dermatitis treated by dermatology - new rx has done well.   Athlete's foot: Rash between the toes past month. Usually wears socks. No recent treatment.    Patient Active Problem List   Diagnosis Date Noted  . Essential hypertension 02/09/2015  . Hyperlipidemia 02/09/2015  . Health care maintenance 10/20/2011  . Tinea pedis 10/20/2011   Past Medical History:  Diagnosis Date  . Allergy   . Essential hypertension 02/09/2015  . Hyperlipidemia 02/09/2015   Past Surgical History:  Procedure Laterality Date  . NASAL SINUS SURGERY  2005   No Known Allergies Prior to Admission medications   Medication Sig Start Date End Date  Taking? Authorizing Provider  amLODipine (NORVASC) 2.5 MG tablet TAKE 1 TABLET(2.5 MG) BY MOUTH DAILY 03/03/18  Yes Shade Flood, MD  hydrochlorothiazide (HYDRODIURIL) 25 MG tablet Take 1 tablet (25 mg total) by mouth daily. 03/03/18  Yes Shade Flood, MD   Social History   Socioeconomic History  . Marital status: Married    Spouse name: Not on file  . Number of children: Not on file  . Years of education: Not on file  . Highest education level: Not on file  Occupational History  . Not on file  Social Needs  . Financial resource strain: Not on file  . Food insecurity    Worry: Not on file    Inability: Not on file  . Transportation needs    Medical: Not on file    Non-medical: Not on file  Tobacco Use  . Smoking status: Never Smoker  . Smokeless tobacco: Never Used  Substance and Sexual Activity  . Alcohol use: No    Alcohol/week: 0.0  standard drinks  . Drug use: No  . Sexual activity: Yes  Lifestyle  . Physical activity    Days per week: Not on file    Minutes per session: Not on file  . Stress: Not on file  Relationships  . Social Musician on phone: Not on file    Gets together: Not on file    Attends religious service: Not on file    Active member of club or organization: Not on file    Attends meetings of clubs or organizations: Not on file    Relationship status: Not on file  . Intimate partner violence    Fear of current or ex partner: Not on file    Emotionally abused: Not on file    Physically abused: Not on file    Forced sexual activity: Not on file  Other Topics Concern  . Not on file  Social History Narrative  . Not on file    Review of Systems 13 point review of systems per patient health survey noted.  Negative other than as indicated above or in HPI.      Objective:   Physical Exam Vitals signs reviewed.  Constitutional:      Appearance: He is well-developed.  HENT:     Head: Normocephalic and atraumatic.     Right Ear: External ear normal.     Left Ear: External ear normal.  Eyes:     Conjunctiva/sclera: Conjunctivae normal.     Pupils: Pupils are equal, round, and reactive to light.  Neck:     Musculoskeletal: Normal range of motion and neck supple.     Thyroid: No thyromegaly.  Cardiovascular:     Rate and Rhythm: Normal rate and regular rhythm.     Heart sounds: Normal heart sounds.  Pulmonary:     Effort: Pulmonary effort is normal. No respiratory distress.     Breath sounds: Normal breath sounds. No wheezing.  Abdominal:     General: There is no distension.     Palpations: Abdomen is soft.     Tenderness: There is no abdominal tenderness.     Hernia: There is no hernia in the left inguinal area or right inguinal area.  Genitourinary:    Prostate: Normal.  Musculoskeletal: Normal range of motion.        General: No tenderness.  Lymphadenopathy:      Cervical: No cervical adenopathy.  Skin:    General: Skin is  warm and dry.     Findings: Rash (wet appearance b/t web spaces bilaterally. min maceration. spares 1st -2nd toe webspace. ) present.  Neurological:     Mental Status: He is alert and oriented to person, place, and time.     Deep Tendon Reflexes: Reflexes are normal and symmetric.  Psychiatric:        Behavior: Behavior normal.    Vitals:   03/06/19 0840 03/06/19 0856  BP: (!) 164/94 (!) 144/80  Pulse: 73   Temp: 98.3 F (36.8 C)   TempSrc: Oral   SpO2: 97%   Weight: 179 lb 6.4 oz (81.4 kg)        Assessment & Plan:    Dakota Bowman is a 48 y.o. male Annual physical exam  - -anticipatory guidance as below in AVS, screening labs above. Health maintenance items as above in HPI discussed/recommended as applicable.   Essential hypertension - Plan: Comprehensive metabolic panel, amLODipine (NORVASC) 5 MG tablet, hydrochlorothiazide (HYDRODIURIL) 25 MG tablet  - decreased control, will increase norvasc to  qd - potential side effects and rtc precautions given.   Tinea pedis of both feet - Plan: clotrimazole (LOTRIMIN) 1 % cream  - handout given, lotrimin RX.   Hyperlipidemia, unspecified hyperlipidemia type - Plan: Lipid Panel, Comprehensive metabolic panel  -Borderline ASCVD risk previously.  Slightly higher today likely due to elevated blood pressure reading.  Check lipids and decide on meds  Screening for thyroid disorder  Needs flu shot - Plan: Flu Vaccine QUAD 6+ mos PF IM (Fluarix Quad PF)   Meds ordered this encounter  Medications  . amLODipine (NORVASC) 5 MG tablet    Sig: TAKE 1 TABLET(2.5 MG) BY MOUTH DAILY    Dispense:  90 tablet    Refill:  1  . hydrochlorothiazide (HYDRODIURIL) 25 MG tablet    Sig: Take 1 tablet (25 mg total) by mouth daily.    Dispense:  90 tablet    Refill:  1  . clotrimazole (LOTRIMIN) 1 % cream    Sig: Apply 1 application topically 2 (two) times daily. Between toes.     Dispense:  30 g    Refill:  1   Patient Instructions    Increase amlodipine to  per day, no change in hydrochlorothiazide. I will check cholesterol levels, but continue exercise most days per week.   Antifungal cream between toes twice per day.  Allow those areas to dry after showering, and use drying powder over-the-counter if needed throughout the day if your feet sweat.  Cotton socks or other breathable material is recommended.  See other information below.  Follow-up if that is not improving with treatment.   Athlete's Foot  Athlete's foot (tinea pedis) is a fungal infection of the skin on your feet. It often occurs on the skin that is between or underneath the toes. It can also occur on the soles of your feet. The infection can spread from person to person (is contagious). It can also spread when a person's bare feet come in contact with the fungus on shower floors or on items such as shoes. What are the causes? This condition is caused by a fungus that grows in warm, moist places. You can get athlete's foot by sharing shoes, shower stalls, towels, and wet floors with someone who is infected. Not washing your feet or changing your socks often enough can also lead to athlete's foot. What increases the risk? This condition is more likely to develop in:  Men.  People who have a weak body defense system (immune system).  People who have diabetes.  People who use public showers, such as at a gym.  People who wear heavy-duty shoes, such as Youth worker.  Seasons with warm, humid weather. What are the signs or symptoms? Symptoms of this condition include:  Itchy areas between your toes or on the soles of your feet.  White, flaky, or scaly areas between your toes or on the soles of your feet.  Very itchy small blisters between your toes or on the soles of your feet.  Small cuts in your skin. These cuts can become infected.  Thick or discolored toenails. How  is this diagnosed? This condition may be diagnosed with a physical exam and a review of your medical history. Your health care provider may also take a skin or toenail sample to examine under a microscope. How is this treated? This condition is treated with antifungal medicines. These may be applied as powders, ointments, or creams. In severe cases, an oral antifungal medicine may be given. Follow these instructions at home: Medicines  Apply or take over-the-counter and prescription medicines only as told by your health care provider.  Apply your antifungal medicine as told by your health care provider. Do not stop using the antifungal even if your condition improves. Foot care  Do not scratch your feet.  Keep your feet dry: ? Wear cotton or wool socks. Change your socks every day or if they become wet. ? Wear shoes that allow air to flow, such as sandals or canvas tennis shoes.  Wash and dry your feet, including the area between your toes. Also, wash and dry your feet: ? Every day or as told by your health care provider. ? After exercising. General instructions  Do not let others use towels, shoes, nail clippers, or other personal items that touch your feet.  Protect your feet by wearing sandals in wet areas, such as locker rooms and shared showers.  Keep all follow-up visits as told by your health care provider. This is important.  If you have diabetes, keep your blood sugar under control. Contact a health care provider if:  You have a fever.  You have swelling, soreness, warmth, or redness in your foot.  Your feet are not getting better with treatment.  Your symptoms get worse.  You have new symptoms. Summary  Athlete's foot (tinea pedis) is a fungal infection of the skin on your feet. It often occurs on skin that is between or underneath the toes.  This condition is caused by a fungus that grows in warm, moist places.  Symptoms include white, flaky, or scaly areas  between your toes or on the soles of your feet.  This condition is treated with antifungal medicines.  Keep your feet clean. Always dry them thoroughly. This information is not intended to replace advice given to you by your health care provider. Make sure you discuss any questions you have with your health care provider. Document Released: 05/06/2000 Document Revised: 05/04/2017 Document Reviewed: 02/27/2017 Elsevier Patient Education  2020 [employer].    Keeping you healthy  Get these tests  Blood pressure- Have your blood pressure checked once a year by your healthcare provider.  Normal blood pressure is 120/80.  Weight- Have your body mass index (BMI) calculated to screen for obesity.  BMI is a measure of body fat based on height and weight. You can also calculate your own BMI at https://www.west-esparza.com/.  Cholesterol- Have  your cholesterol checked regularly starting at age 86, sooner may be necessary if you have diabetes, high blood pressure, if a family member developed heart diseases at an early age or if you smoke.   Chlamydia, HIV, and other sexual transmitted disease- Get screened each year until the age of 69 then within three months of each new sexual partner.  Diabetes- Have your blood sugar checked regularly if you have high blood pressure, high cholesterol, a family history of diabetes or if you are overweight.  Get these vaccines  Flu shot- Every fall.  Tetanus shot- Every 10 years.  Menactra- Single dose; prevents meningitis.  Take these steps  Don't smoke- If you do smoke, ask your healthcare provider about quitting. For tips on how to quit, go to www.smokefree.gov or call 1-800-QUIT-NOW.  Be physically active- Exercise 5 days a week for at least 30 minutes.  If you are not already physically active start slow and gradually work up to 30 minutes of moderate physical activity.  Examples of moderate activity include walking briskly, mowing the yard, dancing,  swimming bicycling, etc.  Eat a healthy diet- Eat a variety of healthy foods such as fruits, vegetables, low fat milk, low fat cheese, yogurt, lean meats, poultry, fish, beans, tofu, etc.  For more information on healthy eating, go to www.thenutritionsource.org  Drink alcohol in moderation- Limit alcohol intake two drinks or less a day.  Never drink and drive.  Dentist- Brush and floss teeth twice daily; visit your dentis twice a year.  Depression-Your emotional health is as important as your physical health.  If you're feeling down, losing interest in things you normally enjoy please talk with your healthcare provider.  Gun Safety- If you keep a gun in your home, keep it unloaded and with the safety lock on.  Bullets should be stored separately.  Helmet use- Always wear a helmet when riding a motorcycle, bicycle, rollerblading or skateboarding.  Safe sex- If you may be exposed to a sexually transmitted infection, use a condom  Seat belts- Seat bels can save your life; always wear one.  Smoke/Carbon Monoxide detectors- These detectors need to be installed on the appropriate level of your home.  Replace batteries at least once a year.  Skin Cancer- When out in the sun, cover up and use sunscreen SPF 15 or higher.  Violence- If anyone is threatening or hurting you, please tell your healthcare provider.   If you have lab work done today you will be contacted with your lab results within the next 2 weeks.  If you have not heard from [place] then please contact us. The fastest way to get your results is to register for My Chart.   IF you received an x-ray today, you will receive an invoice from [facility]. Please contact [facility] at [phone] with questions or concerns regarding your invoice.   IF you received labwork today, you will receive an invoice from [facility]. Please contact [facility] at [phone] with questions or concerns regarding your invoice.   Our billing  staff will not be able to assist you with questions regarding bills from these companies.  You will be contacted with the lab results as soon as they are available. The fastest way to get your results is to activate your My Chart account. Instructions are located on the last page of this paperwork. If you have not heard from [place] regarding the results in 2 weeks, please contact this office.       Signed,  Merri Ray, MD [facility].  03/06/19 9:41 AM

## 2019-03-07 LAB — COMPREHENSIVE METABOLIC PANEL
ALT: 16 IU/L (ref 0–44)
AST: 22 IU/L (ref 0–40)
Albumin/Globulin Ratio: 1.7 (ref 1.2–2.2)
Albumin: 4.5 g/dL (ref 4.0–5.0)
Alkaline Phosphatase: 66 IU/L (ref 39–117)
BUN/Creatinine Ratio: 13 (ref 9–20)
BUN: 15 mg/dL (ref 6–24)
Bilirubin Total: 0.2 mg/dL (ref 0.0–1.2)
CO2: 21 mmol/L (ref 20–29)
Calcium: 9.5 mg/dL (ref 8.7–10.2)
Chloride: 102 mmol/L (ref 96–106)
Creatinine, Ser: 1.12 mg/dL (ref 0.76–1.27)
GFR calc Af Amer: 89 mL/min/{1.73_m2} (ref >59)
GFR calc non Af Amer: 77 mL/min/{1.73_m2} (ref >59)
Globulin, Total: 2.6 g/dL (ref 1.5–4.5)
Glucose: 89 mg/dL (ref 65–99)
Potassium: 4.3 mmol/L (ref 3.5–5.2)
Sodium: 140 mmol/L (ref 134–144)
Total Protein: 7.1 g/dL (ref 6.0–8.5)

## 2019-03-07 LAB — LIPID PANEL
Chol/HDL Ratio: 3.1 ratio (ref 0.0–5.0)
Cholesterol, Total: 221 mg/dL — ABNORMAL HIGH (ref 100–199)
HDL: 71 mg/dL (ref >39)
LDL Chol Calc (NIH): 141 mg/dL — ABNORMAL HIGH (ref 0–99)
Triglycerides: 53 mg/dL (ref 0–149)
VLDL Cholesterol Cal: 9 mg/dL (ref 5–40)

## 2019-03-07 LAB — TSH: TSH: 1.19 u[IU]/mL (ref 0.450–4.500)

## 2019-09-04 ENCOUNTER — Other Ambulatory Visit: Payer: Self-pay

## 2019-09-04 ENCOUNTER — Ambulatory Visit: Payer: Managed Care, Other (non HMO) | Admitting: Family Medicine

## 2019-09-04 ENCOUNTER — Encounter: Payer: Self-pay | Admitting: Family Medicine

## 2019-09-04 VITALS — BP 146/96 | HR 67 | Temp 98.5°F | Ht 68.0 in | Wt 183.0 lb

## 2019-09-04 DIAGNOSIS — I10 Essential (primary) hypertension: Secondary | ICD-10-CM | POA: Diagnosis not present

## 2019-09-04 DIAGNOSIS — E785 Hyperlipidemia, unspecified: Secondary | ICD-10-CM | POA: Diagnosis not present

## 2019-09-04 DIAGNOSIS — Z1211 Encounter for screening for malignant neoplasm of colon: Secondary | ICD-10-CM | POA: Diagnosis not present

## 2019-09-04 DIAGNOSIS — B353 Tinea pedis: Secondary | ICD-10-CM

## 2019-09-04 LAB — LIPID PANEL
Chol/HDL Ratio: 3.5 ratio (ref 0.0–5.0)
Cholesterol, Total: 210 mg/dL — ABNORMAL HIGH (ref 100–199)
HDL: 60 mg/dL (ref >39)
LDL Chol Calc (NIH): 132 mg/dL — ABNORMAL HIGH (ref 0–99)
Triglycerides: 104 mg/dL (ref 0–149)
VLDL Cholesterol Cal: 18 mg/dL (ref 5–40)

## 2019-09-04 LAB — COMPREHENSIVE METABOLIC PANEL
ALT: 16 IU/L (ref 0–44)
AST: 26 IU/L (ref 0–40)
Albumin/Globulin Ratio: 1.7 (ref 1.2–2.2)
Albumin: 4.4 g/dL (ref 4.0–5.0)
Alkaline Phosphatase: 63 IU/L (ref 39–117)
BUN/Creatinine Ratio: 19 (ref 9–20)
BUN: 23 mg/dL (ref 6–24)
Bilirubin Total: <0.2 mg/dL (ref 0.0–1.2)
CO2: 23 mmol/L (ref 20–29)
Calcium: 9.6 mg/dL (ref 8.7–10.2)
Chloride: 105 mmol/L (ref 96–106)
Creatinine, Ser: 1.24 mg/dL (ref 0.76–1.27)
GFR calc Af Amer: 78 mL/min/{1.73_m2} (ref >59)
GFR calc non Af Amer: 67 mL/min/{1.73_m2} (ref >59)
Globulin, Total: 2.6 g/dL (ref 1.5–4.5)
Glucose: 96 mg/dL (ref 65–99)
Potassium: 4.1 mmol/L (ref 3.5–5.2)
Sodium: 139 mmol/L (ref 134–144)
Total Protein: 7 g/dL (ref 6.0–8.5)

## 2019-09-04 MED ORDER — CLOTRIMAZOLE 1 % EX CREA: 1.0000 "application " | TOPICAL_CREAM | Freq: Two times a day (BID) | CUTANEOUS | 1 refills | Status: AC

## 2019-09-04 MED ORDER — HYDROCHLOROTHIAZIDE 25 MG PO TABS: 25.0000 mg | ORAL_TABLET | Freq: Every day | ORAL | 1 refills | Status: DC

## 2019-09-04 MED ORDER — AMLODIPINE BESYLATE 10 MG PO TABS: 10.0000 mg | ORAL_TABLET | Freq: Every day | ORAL | 1 refills | Status: DC

## 2019-09-04 NOTE — Patient Instructions (Addendum)
Try increasing to 10mg  of amlodipine ( 2 of the 5 mg pills ok for now, but new 10mg  prescription at your pharmacy).  If lightheadeded, dizzy, or new side effects return to 5mg  and let me now. Keep a record of your blood pressures outside of the office and if not below 140/90, follow up in next few weeks.   If cholesterol elevated, may need new med.  I will refer you for colonoscopy.   Return to the clinic or go to the nearest emergency room if any of your symptoms worsen or new symptoms occur.  How to Take Your Blood Pressure Blood pressure is a measurement of how strongly your blood is pressing against the walls of your arteries. Arteries are blood vessels that carry blood from your heart throughout your body. Your health care provider takes your blood pressure at each office visit. You can also take your own blood pressure at home with a blood pressure machine. You may need to take your own blood pressure:  To confirm a diagnosis of high blood pressure (hypertension).  To monitor your blood pressure over time.  To make sure your blood pressure medicine is working. Supplies needed: To take your blood pressure, you will need a blood pressure machine. You can buy a blood pressure machine, or blood pressure monitor, at most drugstores or online. There are several types of home blood pressure monitors. When choosing one, consider the following:  Choose a monitor that has an arm cuff.  Choose a cuff that wraps snugly around your upper arm. You should be able to fit only one finger between your arm and the cuff.  Do not choose a monitor that measures your blood pressure from your wrist or finger. Your health care provider can suggest a reliable monitor that will meet your needs. How to prepare To get the most accurate reading, avoid the following for 30 minutes before you check your blood pressure:  Drinking caffeine.  Drinking alcohol.  Eating.  Smoking.  Exercising. Five minutes  before you check your blood pressure:  Empty your bladder.  Sit quietly without talking in a dining chair, rather than in a soft couch or armchair. How to take your blood pressure To check your blood pressure, follow the instructions in the manual that came with your blood pressure monitor. If you have a digital blood pressure monitor, the instructions may be as follows: 1. Sit up straight. 2. Place your feet on the floor. Do not cross your ankles or legs. 3. Rest your left arm at the level of your heart on a table or desk or on the arm of a chair. 4. Pull up your shirt sleeve. 5. Wrap the blood pressure cuff around the upper part of your left arm, 1 inch (2.5 cm) above your elbow. It is best to wrap the cuff around bare skin. 6. Fit the cuff snugly around your arm. You should be able to place only one finger between the cuff and your arm. 7. Position the cord inside the groove of your elbow. 8. Press the power button. 9. Sit quietly while the cuff inflates and deflates. 10. Read the digital reading on the monitor screen and write it down (record it). 11. Wait 2-3 minutes, then repeat the steps, starting at step 1. What does my blood pressure reading mean? A blood pressure reading consists of a higher number over a lower number. Ideally, your blood pressure should be below 120/80. The first ("top") number is called the systolic  pressure. It is a measure of the pressure in your arteries as your heart beats. The second ("bottom") number is called the diastolic pressure. It is a measure of the pressure in your arteries as the heart relaxes. Blood pressure is classified into four stages. The following are the stages for adults who do not have a short-term serious illness or a chronic condition. Systolic pressure and diastolic pressure are measured in a unit called mm Hg. Normal  Systolic pressure: below 035.  Diastolic pressure: below 80. Elevated  Systolic pressure: 009-381.  Diastolic  pressure: below 80. Hypertension stage 1  Systolic pressure: 829-937.  Diastolic pressure: 16-96. Hypertension stage 2  Systolic pressure: 789 or above.  Diastolic pressure: 90 or above. You can have prehypertension or hypertension even if only the systolic or only the diastolic number in your reading is higher than normal. Follow these instructions at home:  Check your blood pressure as often as recommended by your health care provider.  Take your monitor to the next appointment with your health care provider to make sure: ? That you are using it correctly. ? That it provides accurate readings.  Be sure you understand what your goal blood pressure numbers are.  Tell your health care provider if you are having any side effects from blood pressure medicine. Contact a health care provider if:  Your blood pressure is consistently high. Get help right away if:  Your systolic blood pressure is higher than 180.  Your diastolic blood pressure is higher than 110. This information is not intended to replace advice given to you by your health care provider. Make sure you discuss any questions you have with your health care provider. Document Revised: 04/21/2017 Document Reviewed: 10/16/2015 Elsevier Patient Education  [employer].   If you have lab work done today you will be contacted with your lab results within the next 2 weeks.  If you have not heard from [place] then please contact us. The fastest way to get your results is to register for My Chart.   IF you received an x-ray today, you will receive an invoice from [facility]. Please contact [facility] at [phone] with questions or concerns regarding your invoice.   IF you received labwork today, you will receive an invoice from [place]. Please contact [facility] at [phone] with questions or concerns regarding your invoice.   Our billing staff will not be able to assist you with questions regarding  bills from these companies.  You will be contacted with the lab results as soon as they are available. The fastest way to get your results is to activate your My Chart account. Instructions are located on the last page of this paperwork. If you have not heard from [place] regarding the results in 2 weeks, please contact this office.

## 2019-09-04 NOTE — Progress Notes (Signed)
Subjective:  Patient ID: Dakota Bowman, male    DOB: 09-09-1970  Age: 49 y.o. MRN: [id number]  CC:  Chief Complaint  Patient presents with  . Follow-up    6 month F/U on hypertension and hyperlipedmia. pt states he has had any issues with these conditions at home. pt checks his BP one-two times per month. pt reports it's within normal range when he checks. pt other than a head ache every now and then pt reports no physical symptoms of hypertension or hyperlidemia.   HPI Dakota Bowman presents for   Hypertension: Takes amlodipine 5 mg daily, hydrochlorothiazide 25 mg daily. Occasional headache only. Usually feels well. No new side effects.  Home readings: Reports normal readings at home - 121/87, some 140/90 at times.  BP Readings from Last 3 Encounters:  09/04/19 (!) 146/96  03/06/19 (!) 144/80  03/03/18 132/90   Lab Results  Component Value Date   CREATININE 1.12 03/06/2019    Tinea pedis: Lotrimin cream has worked for [employer] - needs refill.   Hyperlipidemia: Elevated ASCVD risk of 9.6% in October.  Statin as option versus diet/exercise.  Not currently on medication. Has been exercising more. 2-3 days per week. Less with pandemic.   Wt Readings from Last 3 Encounters:  09/04/19 183 lb (83 kg)  03/06/19 179 lb 6.4 oz (81.4 kg)  03/03/18 175 lb (79.4 kg)   Lab Results  Component Value Date   CHOL 221 (H) 03/06/2019   HDL 71 03/06/2019   LDLCALC 141 (H) 03/06/2019   TRIG 53 03/06/2019   CHOLHDL 3.1 03/06/2019   Lab Results  Component Value Date   ALT 16 03/06/2019   AST 22 03/06/2019   ALKPHOS 66 03/06/2019   BILITOT 0.2 03/06/2019   Health maintenance: Due for colonoscopy  No FH of colon CA. No rectal bleeding. After cologuard and colonoscopy discussion  - chooses Cologuard.   S/p 1 covid vaccine. Did well.   History Patient Active Problem List   Diagnosis Date Noted  . Essential hypertension 02/09/2015  . Hyperlipidemia 02/09/2015  . Health care  maintenance 10/20/2011  . Tinea pedis 10/20/2011   Past Medical History:  Diagnosis Date  . Allergy   . Essential hypertension 02/09/2015  . Hyperlipidemia 02/09/2015   Past Surgical History:  Procedure Laterality Date  . NASAL SINUS SURGERY  2005   No Known Allergies Prior to Admission medications   Medication Sig Start Date End Date Taking? Authorizing Provider  amLODipine (NORVASC) 5 MG tablet TAKE 1 TABLET(2.5 MG) BY MOUTH DAILY 03/06/19  Yes Shade Flood, MD  clotrimazole (LOTRIMIN) 1 % cream Apply 1 application topically 2 (two) times daily. Between toes. 03/06/19  Yes Shade Flood, MD  hydrochlorothiazide (HYDRODIURIL) 25 MG tablet Take 1 tablet (25 mg total) by mouth daily. 03/06/19  Yes Shade Flood, MD   Social History   Socioeconomic History  . Marital status: Married    Spouse name: Not on file  . Number of children: Not on file  . Years of education: Not on file  . Highest education level: Not on file  Occupational History  . Not on file  Tobacco Use  . Smoking status: Never Smoker  . Smokeless tobacco: Never Used  Substance and Sexual Activity  . Alcohol use: No    Alcohol/week: 0.0 standard drinks  . Drug use: No  . Sexual activity: Yes  Other Topics Concern  . Not on file  Social History Narrative  . Not on  file   Social Determinants of Health   Financial Resource Strain:   . Difficulty of Paying Living Expenses:   Food Insecurity:   . Worried About Programme researcher, broadcasting/film/video in the Last Year:   . Barista in the Last Year:   Transportation Needs:   . Freight forwarder (Medical):   Marland Kitchen Lack of Transportation (Non-Medical):   Physical Activity:   . Days of Exercise per Week:   . Minutes of Exercise per Session:   Stress:   . Feeling of Stress :   Social Connections:   . Frequency of Communication with Friends and Family:   . Frequency of Social Gatherings with Friends and Family:   . Attends Religious Services:   . Active  Member of Clubs or Organizations:   . Attends Banker Meetings:   Marland Kitchen Marital Status:   Intimate Partner Violence:   . Fear of Current or Ex-Partner:   . Emotionally Abused:   Marland Kitchen Physically Abused:   . Sexually Abused:     Review of Systems  Constitutional: Negative for fatigue and unexpected weight change.  Eyes: Negative for visual disturbance.  Respiratory: Negative for cough, chest tightness and shortness of breath.   Cardiovascular: Negative for chest pain, palpitations and leg swelling.  Gastrointestinal: Negative for abdominal pain and blood in stool.  Neurological: Negative for dizziness, light-headedness and headaches.     Objective:   Vitals:   09/04/19 0900 09/04/19 0903  BP: (!) 148/97 (!) 146/96  Pulse: 67   Temp: 98.5 F (36.9 C)   TempSrc: Temporal   SpO2: 97%   Weight: 183 lb (83 kg)   Height: 5\' 8"  (1.727 m)      Physical Exam Vitals reviewed.  Constitutional:      Appearance: He is well-developed.  HENT:     Head: Normocephalic and atraumatic.  Eyes:     Pupils: Pupils are equal, round, and reactive to light.  Neck:     Vascular: No carotid bruit or JVD.  Cardiovascular:     Rate and Rhythm: Normal rate and regular rhythm.     Heart sounds: Normal heart sounds. No murmur.  Pulmonary:     Effort: Pulmonary effort is normal.     Breath sounds: Normal breath sounds. No rales.  Skin:    General: Skin is warm and dry.  Neurological:     Mental Status: He is alert and oriented to person, place, and time.        Assessment & Plan:  Dakota Bowman is a 49 y.o. male . Essential hypertension - Plan: Comprehensive metabolic panel, amLODipine (NORVASC) 10 MG tablet, hydrochlorothiazide (HYDRODIURIL) 25 MG tablet  -Decreased control, will increase Norvasc to 10 mg daily, orthostatic/hypotensive precautions given with RTC precautions.  Continue hydrochlorothiazide 25 mg daily, check labs.  Recheck 6 months, sooner if difficulties with  medication or persistent hypertension  Tinea pedis of both feet - Plan: clotrimazole (LOTRIMIN) 1 % cream  -Intermittent, improved with Lotrimin, refilled.  Special screening for malignant neoplasms, colon - Plan: Ambulatory referral to Gastroenterology  -After discussion of Cologuard and colonoscopy including potential repeat testing intervals he chose to have colonoscopy, referral placed.  Hyperlipidemia, unspecified hyperlipidemia type - Plan: Comprehensive metabolic panel, Lipid panel  -Repeat labs, recommend statin if persistent ASCVD risk elevation.  Commended on  restart exercise.   Meds ordered this encounter  Medications  . amLODipine (NORVASC) 10 MG tablet    Sig: Take 1 tablet (10  mg total) by mouth daily.    Dispense:  90 tablet    Refill:  1  . clotrimazole (LOTRIMIN) 1 % cream    Sig: Apply 1 application topically 2 (two) times daily. Between toes.    Dispense:  30 g    Refill:  1  . hydrochlorothiazide (HYDRODIURIL) 25 MG tablet    Sig: Take 1 tablet (25 mg total) by mouth daily.    Dispense:  90 tablet    Refill:  1   Patient Instructions    Try increasing to 10mg  of amlodipine ( 2 of the 5 mg pills ok for now, but new 10mg  prescription at your pharmacy).  If lightheadeded, dizzy, or new side effects return to 5mg  and let me now. Keep a record of your blood pressures outside of the office and if not below 140/90, follow up in next few weeks.   If cholesterol elevated, may need new med.  I will refer you for colonoscopy.   Return to the clinic or go to the nearest emergency room if any of your symptoms worsen or new symptoms occur.  How to Take Your Blood Pressure Blood pressure is a measurement of how strongly your blood is pressing against the walls of your arteries. Arteries are blood vessels that carry blood from your heart throughout your body. Your health care provider takes your blood pressure at each office visit. You can also take your own blood pressure  at home with a blood pressure machine. You may need to take your own blood pressure:  To confirm a diagnosis of high blood pressure (hypertension).  To monitor your blood pressure over time.  To make sure your blood pressure medicine is working. Supplies needed: To take your blood pressure, you will need a blood pressure machine. You can buy a blood pressure machine, or blood pressure monitor, at most drugstores or online. There are several types of home blood pressure monitors. When choosing one, consider the following:  Choose a monitor that has an arm cuff.  Choose a cuff that wraps snugly around your upper arm. You should be able to fit only one finger between your arm and the cuff.  Do not choose a monitor that measures your blood pressure from your wrist or finger. Your health care provider can suggest a reliable monitor that will meet your needs. How to prepare To get the most accurate reading, avoid the following for 30 minutes before you check your blood pressure:  Drinking caffeine.  Drinking alcohol.  Eating.  Smoking.  Exercising. Five minutes before you check your blood pressure:  Empty your bladder.  Sit quietly without talking in a dining chair, rather than in a soft couch or armchair. How to take your blood pressure To check your blood pressure, follow the instructions in the manual that came with your blood pressure monitor. If you have a digital blood pressure monitor, the instructions may be as follows: 1. Sit up straight. 2. Place your feet on the floor. Do not cross your ankles or legs. 3. Rest your left arm at the level of your heart on a table or desk or on the arm of a chair. 4. Pull up your shirt sleeve. 5. Wrap the blood pressure cuff around the upper part of your left arm, 1 inch (2.5 cm) above your elbow. It is best to wrap the cuff around bare skin. 6. Fit the cuff snugly around your arm. You should be able to place only one finger between  the cuff  and your arm. 7. Position the cord inside the groove of your elbow. 8. Press the power button. 9. Sit quietly while the cuff inflates and deflates. 10. Read the digital reading on the monitor screen and write it down (record it). 11. Wait 2-3 minutes, then repeat the steps, starting at step 1. What does my blood pressure reading mean? A blood pressure reading consists of a higher number over a lower number. Ideally, your blood pressure should be below 120/80. The first ("top") number is called the systolic pressure. It is a measure of the pressure in your arteries as your heart beats. The second ("bottom") number is called the diastolic pressure. It is a measure of the pressure in your arteries as the heart relaxes. Blood pressure is classified into four stages. The following are the stages for adults who do not have a short-term serious illness or a chronic condition. Systolic pressure and diastolic pressure are measured in a unit called mm Hg. Normal  Systolic pressure: below 124.  Diastolic pressure: below 80. Elevated  Systolic pressure: 580-998.  Diastolic pressure: below 80. Hypertension stage 1  Systolic pressure: 338-250.  Diastolic pressure: 53-97. Hypertension stage 2  Systolic pressure: 673 or above.  Diastolic pressure: 90 or above. You can have prehypertension or hypertension even if only the systolic or only the diastolic number in your reading is higher than normal. Follow these instructions at home:  Check your blood pressure as often as recommended by your health care provider.  Take your monitor to the next appointment with your health care provider to make sure: ? That you are using it correctly. ? That it provides accurate readings.  Be sure you understand what your goal blood pressure numbers are.  Tell your health care provider if you are having any side effects from blood pressure medicine. Contact a health care provider if:  Your blood pressure is  consistently high. Get help right away if:  Your systolic blood pressure is higher than 180.  Your diastolic blood pressure is higher than 110. This information is not intended to replace advice given to you by your health care provider. Make sure you discuss any questions you have with your health care provider. Document Revised: 04/21/2017 Document Reviewed: 10/16/2015 Elsevier Patient Education  [employer].   If you have lab work done today you will be contacted with your lab results within the next 2 weeks.  If you have not heard from [place] then please contact us. The fastest way to get your results is to register for My Chart.   IF you received an x-ray today, you will receive an invoice from [facility]. Please contact [facility] at [phone] with questions or concerns regarding your invoice.   IF you received labwork today, you will receive an invoice from [place]. Please contact [facility] at [phone] with questions or concerns regarding your invoice.   Our billing staff will not be able to assist you with questions regarding bills from these companies.  You will be contacted with the lab results as soon as they are available. The fastest way to get your results is to activate your My Chart account. Instructions are located on the last page of this paperwork. If you have not heard from [place] regarding the results in 2 weeks, please contact this office.         Signed, Merri Ray, MD [facility]

## 2019-09-05 ENCOUNTER — Encounter: Payer: Self-pay | Admitting: Internal Medicine

## 2019-10-07 IMAGING — CT CT ANGIO CHEST
2 of 7 series · 13 of 36 positions shown · IV contrast (iopamidol)
Comparison: Chest x-ray 06/23/2017

CLINICAL DATA: Chest pain with abnormal EKG and tachycardia.

EXAM:
CT ANGIOGRAPHY CHEST WITH CONTRAST
TECHNIQUE: Multidetector CT imaging of the chest was performed using the
standard protocol during bolus administration of intravenous
contrast. Multiplanar CT image reconstructions and MIPs were
obtained to evaluate the vascular anatomy.
CONTRAST:  75mL BQ0PTL-77G IOPAMIDOL (BQ0PTL-77G) INJECTION 76%

[Series 6: cta pulmonary 2.00 bv36 s3 cor · coronal · 0.55mm/px · 1 of 128 slices shown]
[im 64/128  mediastinal]
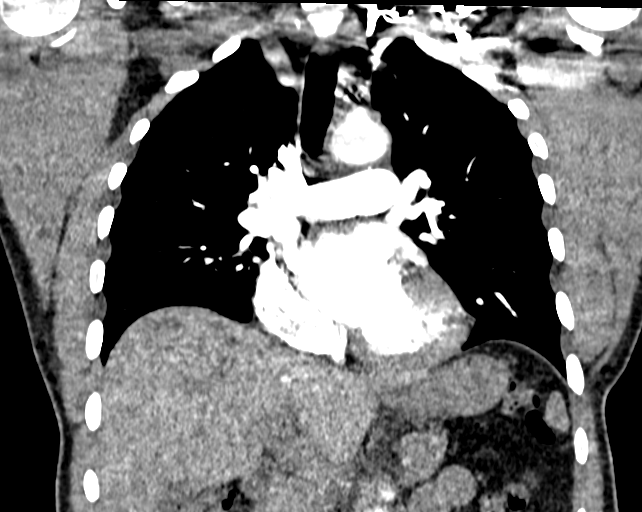

[Series 10: cta pulmonary 1.00 bv36 s3 thins · axial · 0.76mm/px · z∈[+1669,+1907]mm · 12 of 282 slices shown]
[im 22/282  lung]
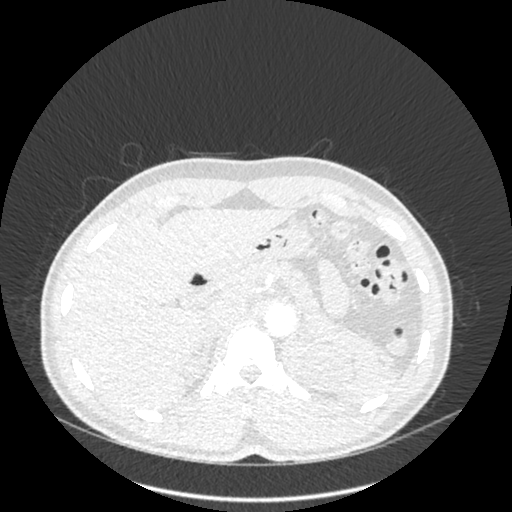
[im 44/282  mediastinal]
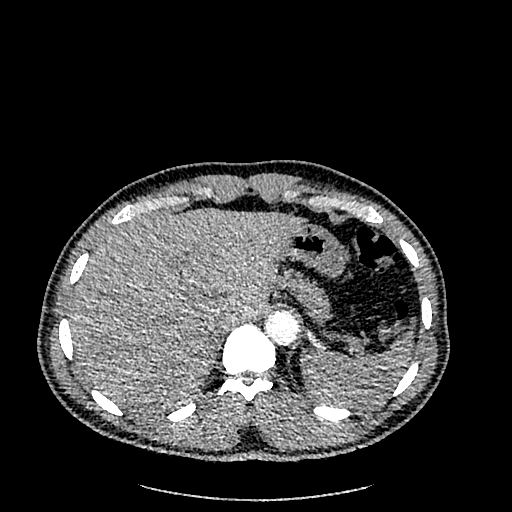
[im 65/282  lung]
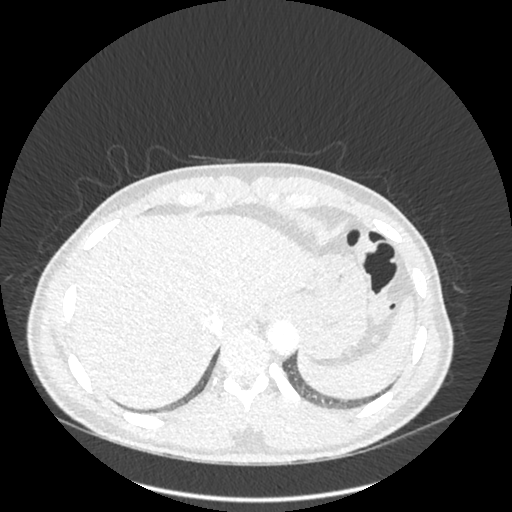
[im 87/282  mediastinal]
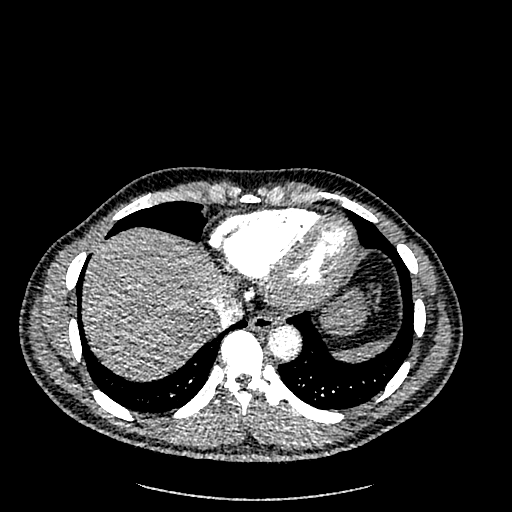
[im 109/282  lung]
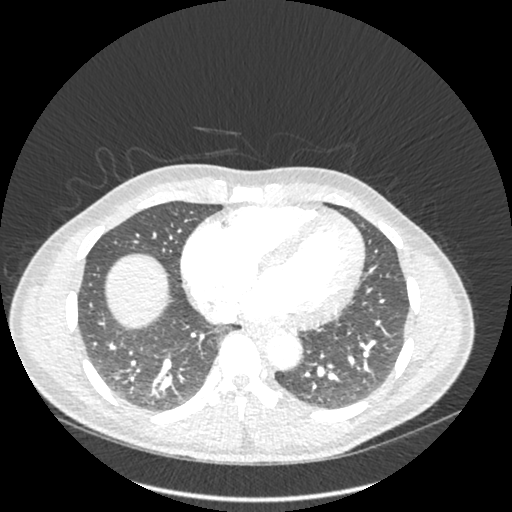
[im 130/282  mediastinal]
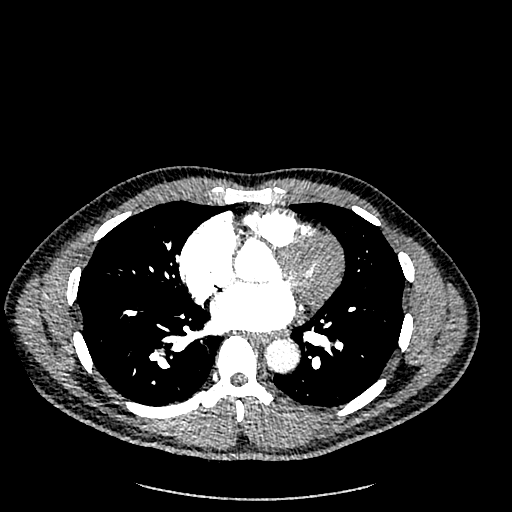
[im 152/282  lung]
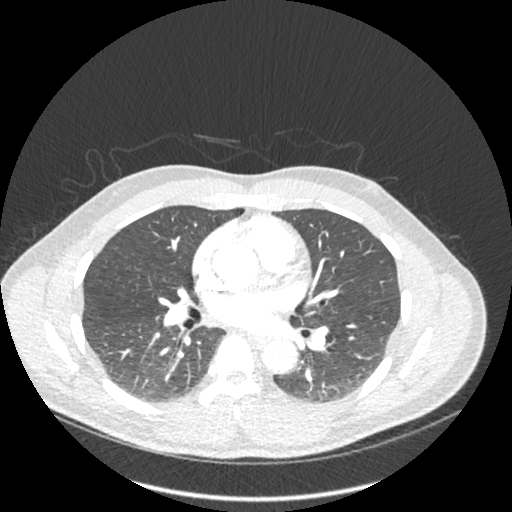
[im 173/282  mediastinal]
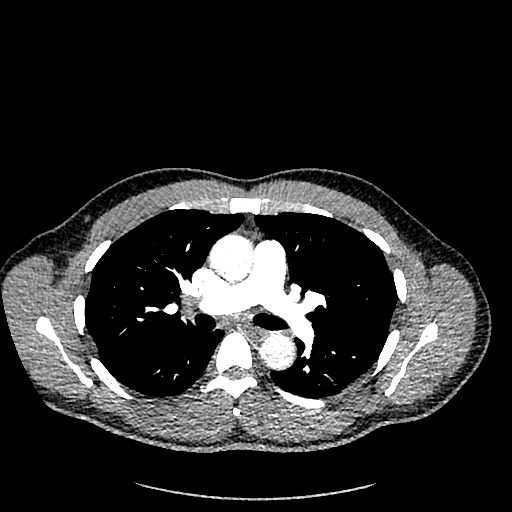
[im 195/282  lung]
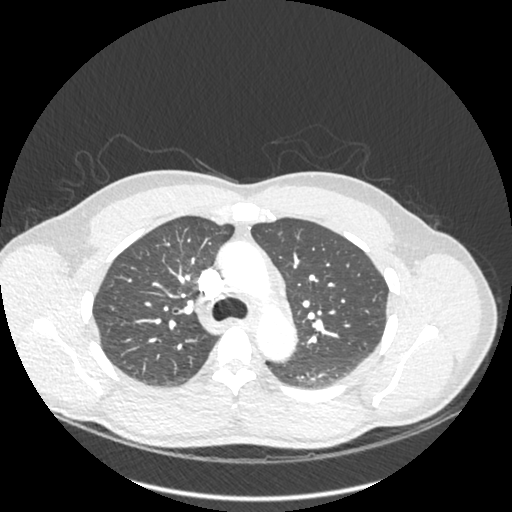
[im 217/282  mediastinal]
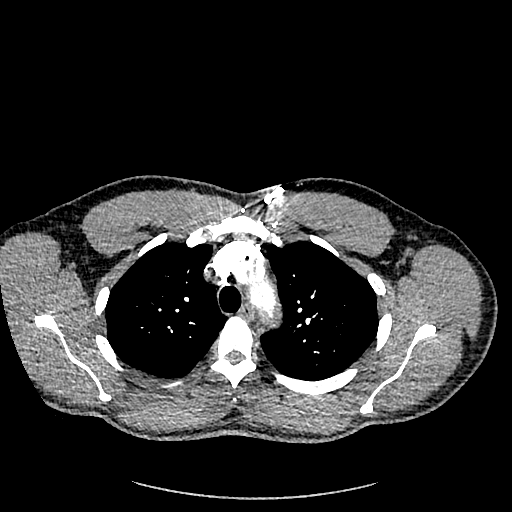
[im 238/282  lung]
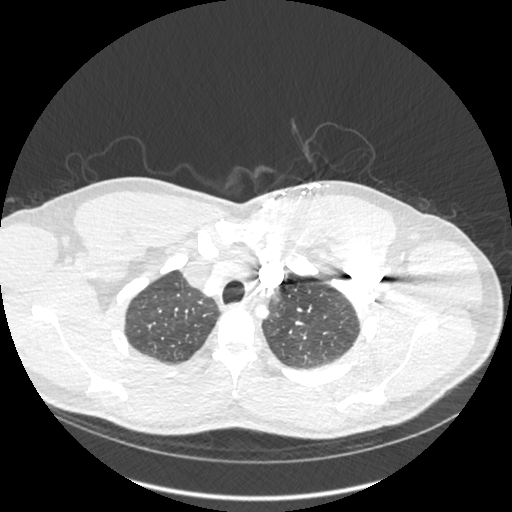
[im 260/282  mediastinal]
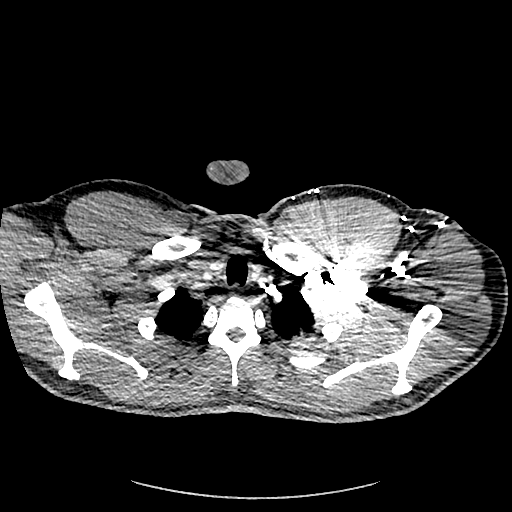

[13 of 36 positions shown; findings below may reference images not displayed]

FINDINGS: Cardiovascular: Heart is normal size. Coronary arteries and thoracic
aorta are within normal. Pulmonary arterial system is well opacified
without evidence of emboli.

Mediastinum/Nodes: No evidence of mediastinal or hilar adenopathy.
Remaining mediastinal structures are normal.

Lungs/Pleura: Lungs are adequately inflated without consolidation or
effusion. Airways are within normal.

Upper Abdomen: Possible stone over the upper pole left kidney. No
acute findings.

Musculoskeletal: Within normal.

Review of the MIP images confirms the above findings.
IMPRESSION: No evidence of pulmonary embolism. No acute cardiopulmonary disease.

## 2019-10-08 ENCOUNTER — Encounter: Payer: Managed Care, Other (non HMO) | Admitting: Internal Medicine

## 2019-10-15 ENCOUNTER — Other Ambulatory Visit: Payer: Self-pay

## 2019-10-15 ENCOUNTER — Ambulatory Visit (AMBULATORY_SURGERY_CENTER): Payer: Self-pay

## 2019-10-15 VITALS — Ht 68.0 in | Wt 181.0 lb

## 2019-10-15 DIAGNOSIS — Z1211 Encounter for screening for malignant neoplasm of colon: Secondary | ICD-10-CM

## 2019-10-15 MED ORDER — SUTAB 1479-225-188 MG PO TABS: 12.0000 | ORAL_TABLET | ORAL | 0 refills | Status: DC

## 2019-10-15 NOTE — Progress Notes (Signed)
No allergies to soy or egg Pt is not on blood thinners or diet pills Denies issues with sedation/intubation Denies atrial flutter/fib Denies constipation   Denies being on K+ pills or supplements  Emmi instructions given to pt  Pt is aware of Covid safety and care partner requirements.

## 2019-10-16 ENCOUNTER — Encounter: Payer: Self-pay | Admitting: Internal Medicine

## 2019-10-29 ENCOUNTER — Other Ambulatory Visit: Payer: Self-pay

## 2019-10-29 ENCOUNTER — Encounter: Payer: Self-pay | Admitting: Internal Medicine

## 2019-10-29 ENCOUNTER — Ambulatory Visit (AMBULATORY_SURGERY_CENTER): Payer: Managed Care, Other (non HMO) | Admitting: Internal Medicine

## 2019-10-29 VITALS — BP 110/73 | HR 65 | Temp 96.9°F | Resp 14 | Ht 68.0 in | Wt 181.0 lb

## 2019-10-29 DIAGNOSIS — Z1211 Encounter for screening for malignant neoplasm of colon: Secondary | ICD-10-CM

## 2019-10-29 MED ORDER — SODIUM CHLORIDE 0.9 % IV SOLN: 500.0000 mL | Freq: Once | INTRAVENOUS | Status: AC

## 2019-10-29 NOTE — Op Note (Signed)
[facility] Patient Name: Dakota Bowman Procedure Date: 10/29/2019 2:29 PM MRN: [id number] Endoscopist: Wilhemina Bonito. Marina Goodell , MD Age: 49 Referring MD:  Date of Birth: [birth year] Gender: Male Account #: [id number] Procedure:                Colonoscopy Indications:              Screening for colorectal malignant neoplasm Medicines:                Monitored Anesthesia Care Procedure:                Pre-Anesthesia Assessment:                           - Prior to the procedure, a History and Physical                            was performed, and patient medications and                            allergies were reviewed. The patient's tolerance of                            previous anesthesia was also reviewed. The risks                            and benefits of the procedure and the sedation                            options and risks were discussed with the patient.                            All questions were answered, and informed consent                            was obtained. Prior Anticoagulants: The patient has                            taken no previous anticoagulant or antiplatelet                            agents. ASA Grade Assessment: II - A patient with                            mild systemic disease. After reviewing the risks                            and benefits, the patient was deemed in                            satisfactory condition to undergo the procedure.                           After obtaining informed consent, the colonoscope  was passed under direct vision. Throughout the                            procedure, the patient's blood pressure, pulse, and                            oxygen saturations were monitored continuously. The                            Colonoscope was introduced through the anus and                            advanced to the the cecum, identified by                            appendiceal orifice and  ileocecal valve. The                            ileocecal valve, appendiceal orifice, and rectum                            were photographed. The quality of the bowel                            preparation was excellent. The colonoscopy was                            performed without difficulty. The patient tolerated                            the procedure well. The bowel preparation used was                            SUPREP via split dose instruction. Scope In: 2:55:08 PM Scope Out: 3:08:07 PM Scope Withdrawal Time: 0 hours 10 minutes 6 seconds  Total Procedure Duration: 0 hours 12 minutes 59 seconds  Findings:                 The entire examined colon appeared normal on direct                            and retroflexion views. Small internal hemorrhoids                            present. Complications:            No immediate complications. Estimated blood loss:                            None. Estimated Blood Loss:     Estimated blood loss: none. Impression:               - The entire examined colon is normal on direct and                            retroflexion  views.                           - No specimens collected. Recommendation:           - Repeat colonoscopy in 10 years for screening                            purposes.                           - Patient has a contact number available for                            emergencies. The signs and symptoms of potential                            delayed complications were discussed with the                            patient. Return to normal activities tomorrow.                            Written discharge instructions were provided to the                            patient.                           - Resume previous diet.                           - Continue present medications. Docia Chuck. Henrene Pastor, MD 10/29/2019 3:12:24 PM This report has been signed electronically.

## 2019-10-29 NOTE — Patient Instructions (Signed)
YOU HAD AN ENDOSCOPIC PROCEDURE TODAY AT THE [facility]:   Refer to the procedure report that was given to you for any specific questions about what was found during the examination.  If the procedure report does not answer your questions, please call your gastroenterologist to clarify.  If you requested that your care partner not be given the details of your procedure findings, then the procedure report has been included in a sealed envelope for you to review at your convenience later.  YOU SHOULD EXPECT: Some feelings of bloating in the abdomen. Passage of more gas than usual.  Walking can help get rid of the air that was put into your GI tract during the procedure and reduce the bloating. If you had a lower endoscopy (such as a colonoscopy or flexible sigmoidoscopy) you may notice spotting of blood in your stool or on the toilet paper. If you underwent a bowel prep for your procedure, you may not have a normal bowel movement for a few days.  Please Note:  You might notice some irritation and congestion in your nose or some drainage.  This is from the oxygen used during your procedure.  There is no need for concern and it should clear up in a day or so.  SYMPTOMS TO REPORT IMMEDIATELY:   Following lower endoscopy (colonoscopy or flexible sigmoidoscopy):  Excessive amounts of blood in the stool  Significant tenderness or worsening of abdominal pains  Swelling of the abdomen that is new, acute  Fever of 100F or higher   For urgent or emergent issues, a gastroenterologist can be reached at any hour by calling [phone]. Do not use MyChart messaging for urgent concerns.    DIET:  We do recommend a small meal at first, but then you may proceed to your regular diet.  Drink plenty of fluids but you should avoid alcoholic beverages for 24 hours.  MEDICATIONS: Continue present medications.  Please see handouts given to you by your recovery nurse.  ACTIVITY:  You should plan to  take it easy for the rest of today and you should NOT DRIVE or use heavy machinery until tomorrow (because of the sedation medicines used during the test).    FOLLOW UP: Our staff will call the number listed on your records 48-72 hours following your procedure to check on you and address any questions or concerns that you may have regarding the information given to you following your procedure. If we do not reach you, we will leave a message.  We will attempt to reach you two times.  During this call, we will ask if you have developed any symptoms of COVID 19. If you develop any symptoms (ie: fever, flu-like symptoms, shortness of breath, cough etc.) before then, please call [phone].  If you test positive for Covid 19 in the 2 weeks post procedure, please call and report this information to us.    If any biopsies were taken you will be contacted by phone or by letter within the next 1-3 weeks.  Please call us at [phone] if you have not heard about the biopsies in 3 weeks.   Thank you for allowing us to provide for your healthcare needs today.   SIGNATURES/CONFIDENTIALITY: You and/or your care partner have signed paperwork which will be entered into your electronic medical record.  These signatures attest to the fact that that the information above on your After Visit Summary has been reviewed and is understood.  Full responsibility of the   confidentiality of this discharge information lies with you and/or your care-partner. 

## 2019-10-29 NOTE — Progress Notes (Signed)
A/ox3, pleased with MAC, report to RN 

## 2019-10-29 NOTE — Progress Notes (Signed)
Pt's states no medical or surgical changes since previsit or office visit. 

## 2019-10-31 ENCOUNTER — Telehealth: Payer: Self-pay

## 2019-10-31 NOTE — Telephone Encounter (Signed)
  Follow up Call-  Call back number 10/29/2019  Post procedure Call Back phone  # [phone]  Permission to leave phone message Yes  Some recent data might be hidden     Patient questions:  Do you have a fever, pain , or abdominal swelling? No. Pain Score  0 *  Have you tolerated food without any problems? Yes.    Have you been able to return to your normal activities? Yes.    Do you have any questions about your discharge instructions: Diet   No. Medications  No. Follow up visit  No.  Do you have questions or concerns about your Care? No.  Actions: * If pain score is 4 or above: No action needed, pain <4.  1. Have you developed a fever since your procedure? no  2.   Have you had an respiratory symptoms (SOB or cough) since your procedure? no  3.   Have you tested positive for COVID 19 since your procedure no  4.   Have you had any family members/close contacts diagnosed with the COVID 19 since your procedure?  no   If yes to any of these questions please route to Laverna Peace, RN and Charlett Lango, RN

## 2020-03-11 ENCOUNTER — Ambulatory Visit (INDEPENDENT_AMBULATORY_CARE_PROVIDER_SITE_OTHER): Payer: Managed Care, Other (non HMO) | Admitting: Family Medicine

## 2020-03-11 ENCOUNTER — Encounter: Payer: Self-pay | Admitting: Family Medicine

## 2020-03-11 ENCOUNTER — Other Ambulatory Visit: Payer: Self-pay

## 2020-03-11 VITALS — BP 131/85 | HR 78 | Temp 97.9°F | Ht 68.0 in | Wt 189.0 lb

## 2020-03-11 DIAGNOSIS — Z0000 Encounter for general adult medical examination without abnormal findings: Secondary | ICD-10-CM

## 2020-03-11 DIAGNOSIS — Z131 Encounter for screening for diabetes mellitus: Secondary | ICD-10-CM

## 2020-03-11 DIAGNOSIS — E785 Hyperlipidemia, unspecified: Secondary | ICD-10-CM

## 2020-03-11 DIAGNOSIS — Z23 Encounter for immunization: Secondary | ICD-10-CM

## 2020-03-11 DIAGNOSIS — I10 Essential (primary) hypertension: Secondary | ICD-10-CM

## 2020-03-11 MED ORDER — HYDROCHLOROTHIAZIDE 25 MG PO TABS: 25.0000 mg | ORAL_TABLET | Freq: Every day | ORAL | 1 refills | Status: DC

## 2020-03-11 MED ORDER — AMLODIPINE BESYLATE 10 MG PO TABS: 10.0000 mg | ORAL_TABLET | Freq: Every day | ORAL | 1 refills | Status: DC

## 2020-03-11 NOTE — Progress Notes (Signed)
Subjective:  Patient ID: Dakota Bowman, male    DOB: [birth year]  Age: 49 y.o. MRN: [id number]  CC:  Chief Complaint  Patient presents with  . Annual Exam    Pt reports he feels well with no complaints.    HPI Dakota Bowman presents for   Annual physical exam  Hypertension: Amlodipine 10 mg daily, HCTZ 25 mg daily.  Amlodipine was increased in April. No new side effects.  Home readings: 109-113/77.   BP Readings from Last 3 Encounters:  03/11/20 131/85  10/29/19 110/73  09/04/19 (!) 146/96   Lab Results  Component Value Date   CREATININE 1.24 09/04/2019   Hyperlipidemia: Not currently on statin. No fast food. Jogging for exercise.  The 10-year ASCVD risk score Denman George DC Montez Hageman., et al., 2013) is: 8.8%   Values used to calculate the score:     Age: 38 years     Sex: Male     Is Non-Hispanic African American: Yes     Diabetic: No     Tobacco smoker: No     Systolic Blood Pressure: 131 mmHg     Is BP treated: Yes     HDL Cholesterol: 60 mg/dL     Total Cholesterol: 210 mg/dL  Lab Results  Component Value Date   CHOL 210 (H) 09/04/2019   HDL 60 09/04/2019   LDLCALC 132 (H) 09/04/2019   TRIG 104 09/04/2019   CHOLHDL 3.5 09/04/2019   Lab Results  Component Value Date   ALT 16 09/04/2019   AST 26 09/04/2019   ALKPHOS 63 09/04/2019   BILITOT <0.2 09/04/2019     Cancer screening: Colonoscopy 10/29/2019 The natural history of prostate cancer and ongoing controversy regarding screening and potential treatment outcomes of prostate cancer has been discussed with the patient. The meaning of a false positive PSA and a false negative PSA has been discussed. He indicates understanding of the limitations of this screening test and wishes to proceed with screening PSA testing. Declines DRE.   Lab Results  Component Value Date   PSA1 0.3 02/14/2017   PSA 0.31 02/09/2015   Immunization History  Administered Date(s) Administered  . Influenza,inj,Quad PF,6+ Mos  02/09/2016, 02/14/2017, 03/03/2018, 03/06/2019, 03/11/2020  . Influenza-Unspecified 03/24/2015  . Tdap 10/20/2011  COVID-19 vaccine: in April. Shingles: defers today.   Depression screen [facility] 2/9 03/11/2020 09/04/2019 03/06/2019 09/10/2018 03/03/2018  Decreased Interest 0 0 0 0 0  Down, Depressed, Hopeless 0 0 0 0 0  PHQ - 2 Score 0 0 0 0 0    Hearing Screening   125Hz  250Hz  500Hz  1000Hz  2000Hz  3000Hz  4000Hz  6000Hz  8000Hz   Right ear:           Left ear:             Visual Acuity Screening   Right eye Left eye Both eyes  Without correction: 20/20 20/30-1 20/20  With correction:     appt next month with optho.   Dental: last month.   Exercise: jogging 3-4 days per week. Body mass index is 28.74 kg/m. Green tea, no sodas.  Wt Readings from Last 3 Encounters:  03/11/20 189 lb (85.7 kg)  10/29/19 181 lb (82.1 kg)  10/15/19 181 lb (82.1 kg)        History Patient Active Problem List   Diagnosis Date Noted  . Essential hypertension 02/09/2015  . Hyperlipidemia 02/09/2015  . Health care maintenance 10/20/2011  . Tinea pedis 10/20/2011   Past Medical History:  Diagnosis Date  .  Allergy    seasonal  . Essential hypertension 02/09/2015  . Hyperlipidemia 02/09/2015   Past Surgical History:  Procedure Laterality Date  . NASAL SINUS SURGERY  2005   Allergies  Allergen Reactions  . Latex    Prior to Admission medications   Medication Sig Start Date End Date Taking? Authorizing Provider  amLODipine (NORVASC) 10 MG tablet Take 1 tablet (10 mg total) by mouth daily. 09/04/19  Yes Shade Flood, MD  clobetasol ointment (TEMOVATE) 0.05 %  06/18/19  Yes [provider]  clotrimazole (LOTRIMIN) 1 % cream Apply 1 application topically 2 (two) times daily. Between toes. 09/04/19  Yes Shade Flood, MD  hydrochlorothiazide (HYDRODIURIL) 25 MG tablet Take 1 tablet (25 mg total) by mouth daily. 09/04/19  Yes Shade Flood, MD   Social History   Socioeconomic  History  . Marital status: Married    Spouse name: Not on file  . Number of children: Not on file  . Years of education: Not on file  . Highest education level: Not on file  Occupational History  . Not on file  Tobacco Use  . Smoking status: Never Smoker  . Smokeless tobacco: Never Used  Vaping Use  . Vaping Use: Never used  Substance and Sexual Activity  . Alcohol use: No    Alcohol/week: 0.0 standard drinks  . Drug use: No  . Sexual activity: Yes  Other Topics Concern  . Not on file  Social History Narrative  . Not on file   Social Determinants of Health   Financial Resource Strain:   . Difficulty of Paying Living Expenses: Not on file  Food Insecurity:   . Worried About Programme researcher, broadcasting/film/video in the Last Year: Not on file  . Ran Out of Food in the Last Year: Not on file  Transportation Needs:   . Lack of Transportation (Medical): Not on file  . Lack of Transportation (Non-Medical): Not on file  Physical Activity:   . Days of Exercise per Week: Not on file  . Minutes of Exercise per Session: Not on file  Stress:   . Feeling of Stress : Not on file  Social Connections:   . Frequency of Communication with Friends and Family: Not on file  . Frequency of Social Gatherings with Friends and Family: Not on file  . Attends Religious Services: Not on file  . Active Member of Clubs or Organizations: Not on file  . Attends Banker Meetings: Not on file  . Marital Status: Not on file  Intimate Partner Violence:   . Fear of Current or Ex-Partner: Not on file  . Emotionally Abused: Not on file  . Physically Abused: Not on file  . Sexually Abused: Not on file    Review of Systems  Constitutional: Negative for fatigue and unexpected weight change.  Eyes: Negative for visual disturbance.  Respiratory: Negative for cough, chest tightness and shortness of breath.   Cardiovascular: Negative for chest pain, palpitations and leg swelling.  Gastrointestinal: Negative for  abdominal pain and blood in stool.  Neurological: Negative for dizziness, light-headedness and headaches.  13 point review of systems per patient health survey noted.  Negative other than as indicated above or in HPI.     Objective:   Vitals:   03/11/20 0819  BP: 131/85  Pulse: 78  Temp: 97.9 F (36.6 C)  TempSrc: Temporal  SpO2: 100%  Weight: 189 lb (85.7 kg)  Height: 5\' 8"  (1.727 m)  Physical Exam Vitals and nursing note reviewed.  Constitutional:      Appearance: He is well-developed.  HENT:     Head: Normocephalic and atraumatic.     Right Ear: External ear normal.     Left Ear: External ear normal.  Eyes:     Conjunctiva/sclera: Conjunctivae normal.     Pupils: Pupils are equal, round, and reactive to light.  Neck:     Thyroid: No thyromegaly.  Cardiovascular:     Rate and Rhythm: Normal rate and regular rhythm.     Heart sounds: Normal heart sounds.  Pulmonary:     Effort: Pulmonary effort is normal. No respiratory distress.     Breath sounds: Normal breath sounds. No wheezing.  Abdominal:     General: There is no distension.     Palpations: Abdomen is soft.     Tenderness: There is no abdominal tenderness.  Musculoskeletal:        General: No tenderness. Normal range of motion.     Cervical back: Normal range of motion and neck supple.  Lymphadenopathy:     Cervical: No cervical adenopathy.  Skin:    General: Skin is warm and dry.  Neurological:     Mental Status: He is alert and oriented to person, place, and time.     Deep Tendon Reflexes: Reflexes are normal and symmetric.  Psychiatric:        Behavior: Behavior normal.     Assessment & Plan:  Dakota Bowman is a 49 y.o. male . Annual physical exam  - -anticipatory guidance as below in AVS, screening labs above. Health maintenance items as above in HPI discussed/recommended as applicable.   Need for vaccination - Plan: Flu Vaccine QUAD 36+ mos IM  Essential hypertension - Plan: Lipid  panel, Comprehensive metabolic panel, amLODipine (NORVASC) 10 MG tablet, hydrochlorothiazide (HYDRODIURIL) 25 MG tablet  -  Stable, tolerating current regimen. Medications refilled. Labs pending as above.   Hyperlipidemia, unspecified hyperlipidemia type - Plan: Lipid panel, Comprehensive metabolic panel  -Check labs.  Consider statin depending on ASCVD risk score.  Continue exercise.  Watch portions for weight management.  Screening for diabetes mellitus - Plan: Hemoglobin A1c   Meds ordered this encounter  Medications  . amLODipine (NORVASC) 10 MG tablet    Sig: Take 1 tablet (10 mg total) by mouth daily.    Dispense:  90 tablet    Refill:  1  . hydrochlorothiazide (HYDRODIURIL) 25 MG tablet    Sig: Take 1 tablet (25 mg total) by mouth daily.    Dispense:  90 tablet    Refill:  1   Patient Instructions     Keep up the good work with exercise/jogging most days per week. Watch portion sizes with meals. I will check some labs today including cholesterol and we can discuss whether or not medication is needed. Follow-up in 6 months, let me know if there are questions sooner. Take care.   Keeping you healthy  Get these tests  Blood pressure- Have your blood pressure checked once a year by your healthcare provider.  Normal blood pressure is 120/80  Weight- Have your body mass index (BMI) calculated to screen for obesity.  BMI is a measure of body fat based on height and weight. You can also calculate your own BMI at [email or web address].  Cholesterol- Have your cholesterol checked every year.  Diabetes- Have your blood sugar checked regularly if you have high blood pressure, high cholesterol, have a family history of diabetes  or if you are overweight.  Screening for Colon Cancer- Colonoscopy starting at age 50.  Screening may begin sooner depending on your family history and other health conditions. Follow up colonoscopy as directed by your Gastroenterologist.  Screening for  Prostate Cancer- Both blood work (PSA) and a rectal exam help screen for Prostate Cancer.  Screening begins at age 50 with African-American men and at age 50 with Caucasian men.  Screening may begin sooner depending on your family history.   Take these medicinesFlu shot- Every fall.  Tetanus- Every 10 years.  Pneumonia shot- Once after the age of 565; if you are younger than 3565, ask your healthcare provider if you need a Pneumonia shot.  Take these steps  Don't smoke- If you do smoke, talk to your doctor about quitting.  For tips on how to quit, go to www.smokefree.gov or call 1-800-QUIT-NOW.  Be physically active- Exercise 5 days a week for at least 30 minutes.  If you are not already physically active start slow and gradually work up to 30 minutes of moderate physical activity.  Examples of moderate activity include walking briskly, mowing the yard, dancing, swimming, bicycling, etc.  Eat a healthy diet- Eat a variety of healthy food such as fruits, vegetables, low fat milk, low fat cheese, yogurt, lean meant, poultry, fish, beans, tofu, etc. For more information go to www.thenutritionsource.org  Drink alcohol in moderation- Limit alcohol intake to less than two drinks a day. Never drink and drive.  Dentist- Brush and floss twice daily; visit your dentist twice a year.  Depression- Your emotional health is as important as your physical health. If you're feeling down, or losing interest in things you would normally enjoy please talk to your healthcare provider.  Eye exam- Visit your eye doctor every year.  Safe sex- If you may be exposed to a sexually transmitted infection, use a condom.  Seat belts- Seat belts can save your life; always wear one.  Smoke/Carbon Monoxide detectors- These detectors need to be installed on the appropriate level of your home.  Replace batteries at least once a year.  Skin cancer- When out in the sun, cover up and use sunscreen 15 SPF or  higher.  Violence- If anyone is threatening you, please tell your healthcare provider.  Living Will/ Health care power of attorney- Speak with your healthcare provider and family.    If you have lab work done today you will be contacted with your lab results within the next 2 weeks.  If you have not heard from us then please contact us. The fastest way to get your results is to register for My Chart.   IF you received an x-ray today, you will receive an invoice from [facility]. Please contact [facility] at [phone] with questions or concerns regarding your invoice.   IF you received labwork today, you will receive an invoice from [facility]. Please contact [facility] at [phone] with questions or concerns regarding your invoice.   Our billing staff will not be able to assist you with questions regarding bills from these companies.  You will be contacted with the lab results as soon as they are available. The fastest way to get your results is to activate your My Chart account. Instructions are located on the last page of this paperwork. If you have not heard from us regarding the results in 2 weeks, please contact this office.         Signed, Meredith StaggersJeffrey Oasis Goehring, MD [facility]  [facility]  

## 2020-03-11 NOTE — Patient Instructions (Addendum)
Keep up the good work with exercise/jogging most days per week. Watch portion sizes with meals. I will check some labs today including cholesterol and we can discuss whether or not medication is needed. Follow-up in 6 months, let me know if there are questions sooner. Take care.   Keeping you healthy  Get these tests  Blood pressure- Have your blood pressure checked once a year by your healthcare provider.  Normal blood pressure is 120/80  Weight- Have your body mass index (BMI) calculated to screen for obesity.  BMI is a measure of body fat based on height and weight. You can also calculate your own BMI at [email or web address].  Cholesterol- Have your cholesterol checked every year.  Diabetes- Have your blood sugar checked regularly if you have high blood pressure, high cholesterol, have a family history of diabetes or if you are overweight.  Screening for Colon Cancer- Colonoscopy starting at age 43.  Screening may begin sooner depending on your family history and other health conditions. Follow up colonoscopy as directed by your Gastroenterologist.  Screening for Prostate Cancer- Both blood work (PSA) and a rectal exam help screen for Prostate Cancer.  Screening begins at age 67 with African-American men and at age 38 with Caucasian men.  Screening may begin sooner depending on your family history.   Take these medicinesFlu shot- Every fall.  Tetanus- Every 10 years.  Pneumonia shot- Once after the age of 70; if you are younger than 47, ask your healthcare provider if you need a Pneumonia shot.  Take these steps  Don't smoke- If you do smoke, talk to your doctor about quitting.  For tips on how to quit, go to www.smokefree.gov or call 1-800-QUIT-NOW.  Be physically active- Exercise 5 days a week for at least 30 minutes.  If you are not already physically active start slow and gradually work up to 30 minutes of moderate physical activity.  Examples of moderate activity include  walking briskly, mowing the yard, dancing, swimming, bicycling, etc.  Eat a healthy diet- Eat a variety of healthy food such as fruits, vegetables, low fat milk, low fat cheese, yogurt, lean meant, poultry, fish, beans, tofu, etc. For more information go to www.thenutritionsource.org  Drink alcohol in moderation- Limit alcohol intake to less than two drinks a day. Never drink and drive.  Dentist- Brush and floss twice daily; visit your dentist twice a year.  Depression- Your emotional health is as important as your physical health. If you're feeling down, or losing interest in things you would normally enjoy please talk to your healthcare provider.  Eye exam- Visit your eye doctor every year.  Safe sex- If you may be exposed to a sexually transmitted infection, use a condom.  Seat belts- Seat belts can save your life; always wear one.  Smoke/Carbon Monoxide detectors- These detectors need to be installed on the appropriate level of your home.  Replace batteries at least once a year.  Skin cancer- When out in the sun, cover up and use sunscreen 15 SPF or higher.  Violence- If anyone is threatening you, please tell your healthcare provider.  Living Will/ Health care power of attorney- Speak with your healthcare provider and family.    If you have lab work done today you will be contacted with your lab results within the next 2 weeks.  If you have not heard from [place] then please contact us. The fastest way to get your results is to register for My Chart.   IF you  received an x-ray today, you will receive an invoice from [facility]. Please contact [facility] at [phone] with questions or concerns regarding your invoice.   IF you received labwork today, you will receive an invoice from [place]. Please contact [facility] at [phone] with questions or concerns regarding your invoice.   Our billing staff will not be able to assist you with questions regarding bills  from these companies.  You will be contacted with the lab results as soon as they are available. The fastest way to get your results is to activate your My Chart account. Instructions are located on the last page of this paperwork. If you have not heard from [place] regarding the results in 2 weeks, please contact this office.

## 2020-03-12 LAB — COMPREHENSIVE METABOLIC PANEL WITH GFR
ALT: 23 IU/L (ref 0–44)
AST: 23 IU/L (ref 0–40)
Albumin/Globulin Ratio: 1.5 (ref 1.2–2.2)
Albumin: 4.4 g/dL (ref 4.0–5.0)
Alkaline Phosphatase: 60 IU/L (ref 44–121)
BUN/Creatinine Ratio: 12 (ref 9–20)
BUN: 15 mg/dL (ref 6–24)
Bilirubin Total: 0.2 mg/dL (ref 0.0–1.2)
CO2: 26 mmol/L (ref 20–29)
Calcium: 9.6 mg/dL (ref 8.7–10.2)
Chloride: 100 mmol/L (ref 96–106)
Creatinine, Ser: 1.26 mg/dL (ref 0.76–1.27)
GFR calc Af Amer: 76 mL/min/1.73
GFR calc non Af Amer: 66 mL/min/1.73
Globulin, Total: 2.9 g/dL (ref 1.5–4.5)
Glucose: 83 mg/dL (ref 65–99)
Potassium: 3.5 mmol/L (ref 3.5–5.2)
Sodium: 137 mmol/L (ref 134–144)
Total Protein: 7.3 g/dL (ref 6.0–8.5)

## 2020-03-12 LAB — LIPID PANEL
Chol/HDL Ratio: 3.8 ratio (ref 0.0–5.0)
Cholesterol, Total: 233 mg/dL — ABNORMAL HIGH (ref 100–199)
HDL: 62 mg/dL
LDL Chol Calc (NIH): 154 mg/dL — ABNORMAL HIGH (ref 0–99)
Triglycerides: 98 mg/dL (ref 0–149)
VLDL Cholesterol Cal: 17 mg/dL (ref 5–40)

## 2020-03-12 LAB — HEMOGLOBIN A1C
Est. average glucose Bld gHb Est-mCnc: 128 mg/dL
Hgb A1c MFr Bld: 6.1 % — ABNORMAL HIGH (ref 4.8–5.6)

## 2020-09-09 ENCOUNTER — Ambulatory Visit: Payer: Self-pay | Admitting: Family Medicine

## 2020-10-12 ENCOUNTER — Ambulatory Visit: Payer: Managed Care, Other (non HMO) | Admitting: Family Medicine

## 2020-10-12 ENCOUNTER — Other Ambulatory Visit: Payer: Self-pay

## 2020-10-12 ENCOUNTER — Encounter: Payer: Self-pay | Admitting: Family Medicine

## 2020-10-12 VITALS — BP 134/66 | HR 64 | Temp 98.1°F | Resp 17 | Ht 68.0 in | Wt 179.6 lb

## 2020-10-12 DIAGNOSIS — I10 Essential (primary) hypertension: Secondary | ICD-10-CM

## 2020-10-12 DIAGNOSIS — E785 Hyperlipidemia, unspecified: Secondary | ICD-10-CM

## 2020-10-12 LAB — COMPREHENSIVE METABOLIC PANEL
ALT: 16 U/L (ref 0–53)
AST: 27 U/L (ref 0–37)
Albumin: 4.3 g/dL (ref 3.5–5.2)
Alkaline Phosphatase: 52 U/L (ref 39–117)
BUN: 14 mg/dL (ref 6–23)
CO2: 28 mEq/L (ref 19–32)
Calcium: 9.6 mg/dL (ref 8.4–10.5)
Chloride: 102 mEq/L (ref 96–112)
Creatinine, Ser: 1.13 mg/dL (ref 0.40–1.50)
GFR: 75.32 mL/min (ref >60.00)
Glucose, Bld: 77 mg/dL (ref 70–99)
Potassium: 3.7 mEq/L (ref 3.5–5.1)
Sodium: 138 mEq/L (ref 135–145)
Total Bilirubin: 0.5 mg/dL (ref 0.2–1.2)
Total Protein: 7.6 g/dL (ref 6.0–8.3)

## 2020-10-12 LAB — LIPID PANEL
Cholesterol: 208 mg/dL — ABNORMAL HIGH (ref 0–200)
HDL: 60.3 mg/dL (ref >39.00)
LDL Cholesterol: 133 mg/dL — ABNORMAL HIGH (ref 0–99)
NonHDL: 148.11
Total CHOL/HDL Ratio: 3
Triglycerides: 75 mg/dL (ref 0.0–149.0)
VLDL: 15 mg/dL (ref 0.0–40.0)

## 2020-10-12 MED ORDER — HYDROCHLOROTHIAZIDE 25 MG PO TABS: 25.0000 mg | ORAL_TABLET | Freq: Every day | ORAL | 1 refills | Status: DC

## 2020-10-12 MED ORDER — AMLODIPINE BESYLATE 10 MG PO TABS: 10.0000 mg | ORAL_TABLET | Freq: Every day | ORAL | 1 refills | Status: DC

## 2020-10-12 NOTE — Progress Notes (Signed)
Subjective:  Patient ID: Quentez Lober, male    DOB: 12-03-1970  Age: 50 y.o. MRN: [id number]  CC:  Chief Complaint  Patient presents with  . Hypertension    Pt reports no side effects from medications doing well, also denies physical symptoms, medications doing well     HPI Melford Kubicek presents for   Hypertension: Amlodipine 10mg  qd, HCTZ 25mg  qd.  No new side effects.  Home readings: 116-130/80-90's.  Had covid vaccine, has not yet had booster.  BP Readings from Last 3 Encounters:  10/12/20 134/66  03/11/20 131/85  10/29/19 110/73   Lab Results  Component Value Date   CREATININE 1.26 03/11/2020   Hyperlipidemia: No current meds/statin.  [employer] 3-4 days per week for exercise, no fast food.  Borderline ASCVD risk score of 8.8 previously.  fasting today.  The 10-year ASCVD risk score 05-20-1981 DC 03/13/2020., et al., 2013) is: 9.8%   Values used to calculate the score:     Age: 68 years     Sex: Male     Is Non-Hispanic African American: Yes     Diabetic: No     Tobacco smoker: No     Systolic Blood Pressure: 134 mmHg     Is BP treated: Yes     HDL Cholesterol: 62 mg/dL     Total Cholesterol: 233 mg/dL  Lab Results  Component Value Date   CHOL 233 (H) 03/11/2020   HDL 62 03/11/2020   LDLCALC 154 (H) 03/11/2020   TRIG 98 03/11/2020   CHOLHDL 3.8 03/11/2020   Lab Results  Component Value Date   ALT 23 03/11/2020   AST 23 03/11/2020   ALKPHOS 60 03/11/2020   BILITOT 0.2 03/11/2020      History Patient Active Problem List   Diagnosis Date Noted  . Essential hypertension 02/09/2015  . Hyperlipidemia 02/09/2015  . Health care maintenance 10/20/2011  . Tinea pedis 10/20/2011   Past Medical History:  Diagnosis Date  . Allergy    seasonal  . Essential hypertension 02/09/2015  . Hyperlipidemia 02/09/2015   Past Surgical History:  Procedure Laterality Date  . NASAL SINUS SURGERY  2005   Allergies  Allergen Reactions  . Latex    Prior to  Admission medications   Medication Sig Start Date End Date Taking? Authorizing Provider  amLODipine (NORVASC) 10 MG tablet Take 1 tablet (10 mg total) by mouth daily. 03/11/20  Yes 2006, MD  clobetasol ointment (TEMOVATE) 0.05 %  06/18/19  Yes [provider]  clotrimazole (LOTRIMIN) 1 % cream Apply 1 application topically 2 (two) times daily. Between toes. 09/04/19  Yes 06/20/19, MD  hydrochlorothiazide (HYDRODIURIL) 25 MG tablet Take 1 tablet (25 mg total) by mouth daily. 03/11/20  Yes Shade Flood, MD   Social History   Socioeconomic History  . Marital status: Married    Spouse name: Not on file  . Number of children: Not on file  . Years of education: Not on file  . Highest education level: Not on file  Occupational History  . Not on file  Tobacco Use  . Smoking status: Never Smoker  . Smokeless tobacco: Never Used  Vaping Use  . Vaping Use: Never used  Substance and Sexual Activity  . Alcohol use: No    Alcohol/week: 0.0 standard drinks  . Drug use: No  . Sexual activity: Yes  Other Topics Concern  . Not on file  Social History Narrative  . Not on  file   Social Determinants of Health   Financial Resource Strain: Not on file  Food Insecurity: Not on file  Transportation Needs: Not on file  Physical Activity: Not on file  Stress: Not on file  Social Connections: Not on file  Intimate Partner Violence: Not on file    Review of Systems  Constitutional: Negative for fatigue and unexpected weight change.  Eyes: Negative for visual disturbance.  Respiratory: Negative for cough, chest tightness and shortness of breath.   Cardiovascular: Negative for chest pain, palpitations and leg swelling.  Gastrointestinal: Negative for abdominal pain and blood in stool.  Neurological: Negative for dizziness, light-headedness and headaches.     Objective:   Vitals:   10/12/20 0926  BP: 134/66  Pulse: 64  Resp: 17  Temp: 98.1 F (36.7 C)   TempSrc: Temporal  SpO2: 99%  Weight: 179 lb 9.6 oz (81.5 kg)  Height: 5\' 8"  (1.727 m)     Physical Exam Vitals reviewed.  Constitutional:      Appearance: He is well-developed.  HENT:     Head: Normocephalic and atraumatic.  Eyes:     Pupils: Pupils are equal, round, and reactive to light.  Neck:     Vascular: No carotid bruit or JVD.  Cardiovascular:     Rate and Rhythm: Normal rate and regular rhythm.     Heart sounds: Normal heart sounds. No murmur heard.   Pulmonary:     Effort: Pulmonary effort is normal.     Breath sounds: Normal breath sounds. No rales.  Skin:    General: Skin is warm and dry.  Neurological:     Mental Status: He is alert and oriented to person, place, and time.        Assessment & Plan:  Ory Elting is a 50 y.o. male . Hyperlipidemia, unspecified hyperlipidemia type - Plan: Comprehensive metabolic panel, Lipid panel  -  Check ASCVD risk on update labs. Option of low dose or intermittent dose statin, but continue exercise for now.   Essential hypertension - Plan: amLODipine (NORVASC) 10 MG tablet, hydrochlorothiazide (HYDRODIURIL) 25 MG tablet, Comprehensive metabolic panel  -  Stable, tolerating current regimen. Medications refilled. Labs pending as above.   Meds ordered this encounter  Medications  . amLODipine (NORVASC) 10 MG tablet    Sig: Take 1 tablet (10 mg total) by mouth daily.    Dispense:  90 tablet    Refill:  1  . hydrochlorothiazide (HYDRODIURIL) 25 MG tablet    Sig: Take 1 tablet (25 mg total) by mouth daily.    Dispense:  90 tablet    Refill:  1   Patient Instructions  No change in blood pressure meds today.  Depending on cholesterol numbers, we can discuss if meds needed. Keep up the good work with exercise.      Signed, 43, MD [facility]

## 2020-10-12 NOTE — Patient Instructions (Signed)
No change in blood pressure meds today.  Depending on cholesterol numbers, we can discuss if meds needed. Keep up the good work with exercise.

## 2021-04-21 ENCOUNTER — Ambulatory Visit (INDEPENDENT_AMBULATORY_CARE_PROVIDER_SITE_OTHER): Payer: Managed Care, Other (non HMO) | Admitting: Family Medicine

## 2021-04-21 ENCOUNTER — Encounter: Payer: Self-pay | Admitting: Family Medicine

## 2021-04-21 VITALS — BP 134/80 | HR 73 | Temp 98.2°F | Resp 16 | Ht 68.0 in | Wt 188.4 lb

## 2021-04-21 DIAGNOSIS — Z0000 Encounter for general adult medical examination without abnormal findings: Secondary | ICD-10-CM | POA: Diagnosis not present

## 2021-04-21 DIAGNOSIS — Z130 Encounter for screening for diseases of the blood and blood-forming organs and certain disorders involving the immune mechanism: Secondary | ICD-10-CM

## 2021-04-21 DIAGNOSIS — Z125 Encounter for screening for malignant neoplasm of prostate: Secondary | ICD-10-CM

## 2021-04-21 DIAGNOSIS — I10 Essential (primary) hypertension: Secondary | ICD-10-CM

## 2021-04-21 DIAGNOSIS — Z23 Encounter for immunization: Secondary | ICD-10-CM

## 2021-04-21 DIAGNOSIS — E785 Hyperlipidemia, unspecified: Secondary | ICD-10-CM

## 2021-04-21 DIAGNOSIS — Z131 Encounter for screening for diabetes mellitus: Secondary | ICD-10-CM | POA: Diagnosis not present

## 2021-04-21 LAB — CBC WITH DIFFERENTIAL/PLATELET
Basophils Absolute: 0 10*3/uL (ref 0.0–0.1)
Basophils Relative: 0.9 % (ref 0.0–3.0)
Eosinophils Absolute: 0.2 10*3/uL (ref 0.0–0.7)
Eosinophils Relative: 4.5 % (ref 0.0–5.0)
HCT: 42.6 % (ref 39.0–52.0)
Hemoglobin: 13.8 g/dL (ref 13.0–17.0)
Lymphocytes Relative: 54.5 % — ABNORMAL HIGH (ref 12.0–46.0)
Lymphs Abs: 2.3 10*3/uL (ref 0.7–4.0)
MCHC: 32.4 g/dL (ref 30.0–36.0)
MCV: 81.4 fl (ref 78.0–100.0)
Monocytes Absolute: 0.3 10*3/uL (ref 0.1–1.0)
Monocytes Relative: 7 % (ref 3.0–12.0)
Neutro Abs: 1.4 10*3/uL (ref 1.4–7.7)
Neutrophils Relative %: 33.1 % — ABNORMAL LOW (ref 43.0–77.0)
Platelets: 205 10*3/uL (ref 150.0–400.0)
RBC: 5.24 Mil/uL (ref 4.22–5.81)
RDW: 13.5 % (ref 11.5–15.5)
WBC: 4.2 10*3/uL (ref 4.0–10.5)

## 2021-04-21 LAB — LIPID PANEL
Cholesterol: 240 mg/dL — ABNORMAL HIGH (ref 0–200)
HDL: 62.9 mg/dL (ref >39.00)
LDL Cholesterol: 161 mg/dL — ABNORMAL HIGH (ref 0–99)
NonHDL: 176.74
Total CHOL/HDL Ratio: 4
Triglycerides: 77 mg/dL (ref 0.0–149.0)
VLDL: 15.4 mg/dL (ref 0.0–40.0)

## 2021-04-21 LAB — COMPREHENSIVE METABOLIC PANEL
ALT: 14 U/L (ref 0–53)
AST: 20 U/L (ref 0–37)
Albumin: 4.2 g/dL (ref 3.5–5.2)
Alkaline Phosphatase: 50 U/L (ref 39–117)
BUN: 18 mg/dL (ref 6–23)
CO2: 29 mEq/L (ref 19–32)
Calcium: 9.7 mg/dL (ref 8.4–10.5)
Chloride: 104 mEq/L (ref 96–112)
Creatinine, Ser: 1.2 mg/dL (ref 0.40–1.50)
GFR: 69.82 mL/min (ref >60.00)
Glucose, Bld: 88 mg/dL (ref 70–99)
Potassium: 4.1 mEq/L (ref 3.5–5.1)
Sodium: 139 mEq/L (ref 135–145)
Total Bilirubin: 0.4 mg/dL (ref 0.2–1.2)
Total Protein: 7.2 g/dL (ref 6.0–8.3)

## 2021-04-21 LAB — HEMOGLOBIN A1C: Hgb A1c MFr Bld: 6.2 % (ref 4.6–6.5)

## 2021-04-21 LAB — PSA: PSA: 0.3 ng/mL (ref 0.10–4.00)

## 2021-04-21 MED ORDER — AMLODIPINE BESYLATE 10 MG PO TABS: 10.0000 mg | ORAL_TABLET | Freq: Every day | ORAL | 1 refills | Status: DC

## 2021-04-21 MED ORDER — HYDROCHLOROTHIAZIDE 25 MG PO TABS
25.0000 mg | ORAL_TABLET | Freq: Every day | ORAL | 1 refills | Status: DC
Start: 2021-04-21 — End: 2021-10-21

## 2021-04-21 NOTE — Progress Notes (Signed)
Subjective:  Patient ID: Dakota Bowman, male    DOB: 1970/10/19  Age: 50 y.o. MRN: [id number]  CC:  Chief Complaint  Patient presents with   Annual Exam    Pt here to have physical, no concerns today     HPI Dakota Bowman presents for   Annual physical exam  Hyperlipidemia: Diet exercise approach previously.  Has been exercising at [employer] 3 to 4 days/week at his last visit in May.  Borderline ASCVD risk score of 9.6.  Still exercising most days per week.  Wt Readings from Last 3 Encounters:  04/21/21 188 lb 6.4 oz (85.5 kg)  10/12/20 179 lb 9.6 oz (81.5 kg)  03/11/20 189 lb (85.7 kg)    Lab Results  Component Value Date   CHOL 208 (H) 10/12/2020   HDL 60.30 10/12/2020   LDLCALC 133 (H) 10/12/2020   TRIG 75.0 10/12/2020   CHOLHDL 3 10/12/2020   Lab Results  Component Value Date   ALT 16 10/12/2020   AST 27 10/12/2020   ALKPHOS 52 10/12/2020   BILITOT 0.5 10/12/2020    Hypertension: Amlodipine 10 mg daily, HCTZ 25 mg daily. No new side effects.  Home readings:120/70-140/80-90.  No added salt. Home cooking.   BP Readings from Last 3 Encounters:  04/21/21 134/80  10/12/20 134/66  03/11/20 131/85   Lab Results  Component Value Date   CREATININE 1.13 10/12/2020   Depression screen [facility] 2/9 04/21/2021 03/11/2020 09/04/2019 03/06/2019 09/10/2018  Decreased Interest 0 0 0 0 0  Down, Depressed, Hopeless 0 0 0 0 0  PHQ - 2 Score 0 0 0 0 0   Cancer screening: Colonoscopy 10/29/2019 No FH of prostate CA.  The natural history of prostate cancer and ongoing controversy regarding screening and potential treatment outcomes of prostate cancer has been discussed with the patient. The meaning of a false positive PSA and a false negative PSA has been discussed. He indicates understanding of the limitations of this screening test and wishes to proceed with screening PSA testing and DRE.  Lab Results  Component Value Date   PSA1 0.3 02/14/2017   PSA 0.31 02/09/2015     Immunization History  Administered Date(s) Administered   Influenza,inj,Quad PF,6+ Mos 02/09/2016, 02/14/2017, 03/03/2018, 03/06/2019, 03/11/2020   Influenza-Unspecified 03/24/2015   Tdap 10/20/2011  Reported previous [facility]-vacc[facility] at [facility]. , booster recommended at his May visit. Has not had booster.  Flu vaccine today.   Vision Screening   Right eye Left eye Both eyes  Without correction  With correction     No corrective lenses. Optho eval next month  Dental: last month - every 6 months or more   Alcohol:none  Tobacco: none.  Exercise [employer] 3-5 days per week.    History Patient Active Problem List   Diagnosis Date Noted   Essential hypertension 02/09/2015   Hyperlipidemia 02/09/2015   Health care maintenance 10/20/2011   Tinea pedis 10/20/2011   Past Medical History:  Diagnosis Date   Allergy    seasonal   Essential hypertension 02/09/2015   Hyperlipidemia 02/09/2015   Past Surgical History:  Procedure Laterality Date   NASAL SINUS SURGERY  2005   Allergies  Allergen Reactions   Latex    Prior to Admission medications   Medication Sig Start Date End Date Taking? Authorizing Provider  amLODipine (NORVASC) 10 MG tablet Take 1 tablet (10 mg total) by mouth daily. 10/12/20   Shade Flood, MD  clobetasol ointment (TEMOVATE) 0.05 %  06/18/19   [provider]  clotrimazole (LOTRIMIN) 1 % cream Apply 1 application topically 2 (two) times daily. Between toes. 09/04/19   Shade Flood, MD  hydrochlorothiazide (HYDRODIURIL) 25 MG tablet Take 1 tablet (25 mg total) by mouth daily. 10/12/20   Shade Flood, MD   Social History   Socioeconomic History   Marital status: Married    Spouse name: Not on file   Number of children: Not on file   Years of education: Not on file   Highest education level: Not on file  Occupational History   Not on file  Tobacco Use   Smoking status: Never   Smokeless tobacco: Never   Vaping Use   Vaping Use: Never used  Substance and Sexual Activity   Alcohol use: No    Alcohol/week: 0.0 standard drinks   Drug use: No   Sexual activity: Yes  Other Topics Concern   Not on file  Social History Narrative   Not on file   Social Determinants of Health   Financial Resource Strain: Not on file  Food Insecurity: Not on file  Transportation Needs: Not on file  Physical Activity: Not on file  Stress: Not on file  Social Connections: Not on file  Intimate Partner Violence: Not on file    Review of Systems  13 point review of systems per patient health survey noted.  Negative other than as indicated above or in HPI.   Objective:   Vitals:   04/21/21 0809  BP: 134/80  Pulse: 73  Resp: 16  Temp: 98.2 F (36.8 C)  TempSrc: Temporal  SpO2: 98%  Weight: 188 lb 6.4 oz (85.5 kg)  Height: 5\' 8"  (1.727 m)     Physical Exam Vitals reviewed.  Constitutional:      Appearance: He is well-developed.  HENT:     Head: Normocephalic and atraumatic.     Right Ear: External ear normal.     Left Ear: External ear normal.  Eyes:     Conjunctiva/sclera: Conjunctivae normal.     Pupils: Pupils are equal, round, and reactive to light.  Neck:     Thyroid: No thyromegaly.  Cardiovascular:     Rate and Rhythm: Normal rate and regular rhythm.     Heart sounds: Normal heart sounds.  Pulmonary:     Effort: Pulmonary effort is normal. No respiratory distress.     Breath sounds: Normal breath sounds. No wheezing.  Abdominal:     General: There is no distension.     Palpations: Abdomen is soft.     Tenderness: There is no abdominal tenderness.  Genitourinary:    Prostate: Normal.  Musculoskeletal:        General: No tenderness. Normal range of motion.     Cervical back: Normal range of motion and neck supple.  Lymphadenopathy:     Cervical: No cervical adenopathy.  Skin:    General: Skin is warm and dry.  Neurological:     Mental Status: He is alert and oriented  to person, place, and time.     Deep Tendon Reflexes: Reflexes are normal and symmetric.  Psychiatric:        Mood and Affect: Mood normal.        Behavior: Behavior normal.       Assessment & Plan:  Dakota Bowman is a 50 y.o. male . Annual physical exam - Plan: CBC with Differential/Platelet, Comprehensive metabolic panel, Lipid panel, Hemoglobin A1c, PSA  - -anticipatory guidance as  below in AVS, screening labs above. Health maintenance items as above in HPI discussed/recommended as applicable.   Essential hypertension - Plan: Comprehensive metabolic panel, amLODipine (NORVASC) 10 MG tablet, hydrochlorothiazide (HYDRODIURIL) 25 MG tablet  -  Stable, borderline elevation. Home monitoring with rtc precautions. tolerating current regimen. Medications refilled. Labs pending as above.   Hyperlipidemia, unspecified hyperlipidemia type - Plan: Comprehensive metabolic panel, Lipid panel  -Commended on exercise, continue same.  Check lipids.  Depending on ASCVD risk score could consider low-dose/intermittent statin.  Screening for diabetes mellitus - Plan: Comprehensive metabolic panel, Hemoglobin A1c  Screening for prostate cancer - Plan: PSA, no concerns on DRE.  Screening for deficiency anemia - Plan: CBC with Differential/Platelet  Need for influenza vaccination - Plan: Flu Vaccine QUAD 6+ mos PF IM (Fluarix Quad PF) COVID booster recommended at his pharmacy or through [facility]  Meds ordered this encounter  Medications   amLODipine (NORVASC) 10 MG tablet    Sig: Take 1 tablet (10 mg total) by mouth daily.    Dispense:  90 tablet    Refill:  1   hydrochlorothiazide (HYDRODIURIL) 25 MG tablet    Sig: Take 1 tablet (25 mg total) by mouth daily.    Dispense:  90 tablet    Refill:  1    Patient Instructions  Continue to avoid added salt if possible.  No change in meds today, but if your blood pressure is in the 140's more often, let me know and we may need to add  medication.  Keep up the good work with exercise.    [facility] is now offering the bivalent Covid booster vaccine. To schedule an appointment or to find more information, visit [email or web address]. You can also call [phone], Monday through Friday, 7 a.m. to 7 p.m. That can also be given at your pharmacy.    Preventive Care 64-55 Years Old, Male Preventive care refers to lifestyle choices and visits with your health care provider that can promote health and wellness. Preventive care visits are also called wellness exams. What can I expect for my preventive care visit? Counseling During your preventive care visit, your health care provider may ask about your: Medical history, including: Past medical problems. Family medical history. Current health, including: Emotional well-being. Home life and relationship well-being. Sexual activity. Lifestyle, including: Alcohol, nicotine or tobacco, and drug use. Access to firearms. Diet, exercise, and sleep habits. Safety issues such as seatbelt and bike helmet use. Sunscreen use. Work and work Astronomer. Physical exam Your health care provider will check your: Height and weight. These may be used to calculate your BMI (body mass index). BMI is a measurement that tells if you are at a healthy weight. Waist circumference. This measures the distance around your waistline. This measurement also tells if you are at a healthy weight and may help predict your risk of certain diseases, such as type 2 diabetes and high blood pressure. Heart rate and blood pressure. Body temperature. Skin for abnormal spots. What immunizations do I need? Vaccines are usually given at various ages, according to a schedule. Your health care provider will recommend vaccines for you based on your age, medical history, and lifestyle or other factors, such as travel or where you work. What tests do I need? Screening Your health care provider may recommend  screening tests for certain conditions. This may include: Lipid and cholesterol levels. Diabetes screening. This is done by checking your blood sugar (glucose) after you have not eaten for a while (fasting).  Hepatitis B test. Hepatitis C test. HIV (human immunodeficiency virus) test. STI (sexually transmitted infection) testing, if you are at risk. Lung cancer screening. Prostate cancer screening. Colorectal cancer screening. Talk with your health care provider about your test results, treatment options, and if necessary, the need for more tests. Follow these instructions at home: Eating and drinking  Eat a diet that includes fresh fruits and vegetables, whole grains, lean protein, and low-fat dairy products. Take vitamin and mineral supplements as recommended by your health care provider. Do not drink alcohol if your health care provider tells you not to drink. If you drink alcohol: Limit how much you have to 0-2 drinks a day. Know how much alcohol is in your drink. In the [place] one drink equals one 12 oz bottle of beer (355 mL), one 5 oz glass of wine (148 mL), or one 1 oz glass of hard liquor (44 mL). Lifestyle Brush your teeth every morning and night with fluoride toothpaste. Floss one time each day. Exercise for at least 30 minutes 5 or more days each week. Do not use any products that contain nicotine or tobacco. These products include cigarettes, chewing tobacco, and vaping devices, such as e-cigarettes. If you need help quitting, ask your health care provider. Do not use drugs. If you are sexually active, practice safe sex. Use a condom or other form of protection to prevent STIs. Take aspirin only as told by your health care provider. Make sure that you understand how much to take and what form to take. Work with your health care provider to find out whether it is safe and beneficial for you to take aspirin daily. Find healthy ways to manage stress, such as: Meditation, yoga, or  listening to music. Journaling. Talking to a trusted person. Spending time with friends and family. Minimize exposure to UV radiation to reduce your risk of skin cancer. Safety Always wear your seat belt while driving or riding in a vehicle. Do not drive: If you have been drinking alcohol. Do not ride with someone who has been drinking. When you are tired or distracted. While texting. If you have been using any mind-altering substances or drugs. Wear a helmet and other protective equipment during sports activities. If you have firearms in your house, make sure you follow all gun safety procedures. What's next? Go to your health care provider once a year for an annual wellness visit. Ask your health care provider how often you should have your eyes and teeth checked. Stay up to date on all vaccines. This information is not intended to replace advice given to you by your health care provider. Make sure you discuss any questions you have with your health care provider. Document Revised: 11/04/2020 Document Reviewed: 11/04/2020 Elsevier Patient Education  2022 Elsevier Inc.   Managing Your Hypertension Hypertension, also called high blood pressure, is when the force of the blood pressing against the walls of the arteries is too strong. Arteries are blood vessels that carry blood from your heart throughout your body. Hypertension forces the heart to work harder to pump blood and may cause the arteries to become narrow or stiff. Understanding blood pressure readings Your personal target blood pressure may vary depending on your medical conditions, your age, and other factors. A blood pressure reading includes a higher number over a lower number. Ideally, your blood pressure should be below 120/80. You should know that: The first, or top, number is called the systolic pressure. It is a measure of the pressure  in your arteries as your heart beats. The second, or bottom number, is called the  diastolic pressure. It is a measure of the pressure in your arteries as the heart relaxes. Blood pressure is classified into four stages. Based on your blood pressure reading, your health care provider may use the following stages to determine what type of treatment you need, if any. Systolic pressure and diastolic pressure are measured in a unit called mmHg. Normal Systolic pressure: below 120. Diastolic pressure: below 80. Elevated Systolic pressure: 120-129. Diastolic pressure: below 80. Hypertension stage 1 Systolic pressure: 130-139. Diastolic pressure: 80-89. Hypertension stage 2 Systolic pressure: 140 or above. Diastolic pressure: 90 or above. How can this condition affect me? Managing your hypertension is an important responsibility. Over time, hypertension can damage the arteries and decrease blood flow to important parts of the body, including the brain, heart, and kidneys. Having untreated or uncontrolled hypertension can lead to: A heart attack. A stroke. A weakened blood vessel (aneurysm). Heart failure. Kidney damage. Eye damage. Metabolic syndrome. Memory and concentration problems. Vascular dementia. What actions can I take to manage this condition? Hypertension can be managed by making lifestyle changes and possibly by taking medicines. Your health care provider will help you make a plan to bring your blood pressure within a normal range. Nutrition  Eat a diet that is high in fiber and potassium, and low in salt (sodium), added sugar, and fat. An example eating plan is called the Dietary Approaches to Stop Hypertension (DASH) diet. To eat this way: Eat plenty of fresh fruits and vegetables. Try to fill one-half of your plate at each meal with fruits and vegetables. Eat whole grains, such as whole-wheat pasta, brown rice, or whole-grain bread. Fill about one-fourth of your plate with whole grains. Eat low-fat dairy products. Avoid fatty cuts of meat, processed or  cured meats, and poultry with skin. Fill about one-fourth of your plate with lean proteins such as fish, chicken without skin, beans, eggs, and tofu. Avoid pre-made and processed foods. These tend to be higher in sodium, added sugar, and fat. Reduce your daily sodium intake. Most people with hypertension should eat less than 1,500 mg of sodium a day. Lifestyle  Work with your health care provider to maintain a healthy body weight or to lose weight. Ask what an ideal weight is for you. Get at least 30 minutes of exercise that causes your heart to beat faster (aerobic exercise) most days of the week. Activities may include walking, swimming, or biking. Include exercise to strengthen your muscles (resistance exercise), such as weight lifting, as part of your weekly exercise routine. Try to do these types of exercises for 30 minutes at least 3 days a week. Do not use any products that contain nicotine or tobacco, such as cigarettes, e-cigarettes, and chewing tobacco. If you need help quitting, ask your health care provider. Control any long-term (chronic) conditions you have, such as high cholesterol or diabetes. Identify your sources of stress and find ways to manage stress. This may include meditation, deep breathing, or making time for fun activities. Alcohol use Do not drink alcohol if: Your health care provider tells you not to drink. You are pregnant, may be pregnant, or are planning to become pregnant. If you drink alcohol: Limit how much you use to: 0-1 drink a day for women. 0-2 drinks a day for men. Be aware of how much alcohol is in your drink. In the [place] one drink equals one 12 oz bottle of  beer (355 mL), one 5 oz glass of wine (148 mL), or one 1 oz glass of hard liquor (44 mL). Medicines Your health care provider may prescribe medicine if lifestyle changes are not enough to get your blood pressure under control and if: Your systolic blood pressure is 130 or higher. Your diastolic  blood pressure is 80 or higher. Take medicines only as told by your health care provider. Follow the directions carefully. Blood pressure medicines must be taken as told by your health care provider. The medicine does not work as well when you skip doses. Skipping doses also puts you at risk for problems. Monitoring Before you monitor your blood pressure: Do not smoke, drink caffeinated beverages, or exercise within 30 minutes before taking a measurement. Use the bathroom and empty your bladder (urinate). Sit quietly for at least 5 minutes before taking measurements. Monitor your blood pressure at home as told by your health care provider. To do this: Sit with your back straight and supported. Place your feet flat on the floor. Do not cross your legs. Support your arm on a flat surface, such as a table. Make sure your upper arm is at heart level. Each time you measure, take two or three readings one minute apart and record the results. You may also need to have your blood pressure checked regularly by your health care provider. General information Talk with your health care provider about your diet, exercise habits, and other lifestyle factors that may be contributing to hypertension. Review all the medicines you take with your health care provider because there may be side effects or interactions. Keep all visits as told by your health care provider. Your health care provider can help you create and adjust your plan for managing your high blood pressure. Where to find more information National Heart, Lung, and Blood Institute: PopSteam.is American Heart Association: www.heart.org Contact a health care provider if: You think you are having a reaction to medicines you have taken. You have repeated (recurrent) headaches. You feel dizzy. You have swelling in your ankles. You have trouble with your vision. Get help right away if: You develop a severe headache or confusion. You have  unusual weakness or numbness, or you feel faint. You have severe pain in your chest or abdomen. You vomit repeatedly. You have trouble breathing. These symptoms may represent a serious problem that is an emergency. Do not wait to see if the symptoms will go away. Get medical help right away. Call your local emergency services (911 in the [place] Do not drive yourself to the hospital. Summary Hypertension is when the force of blood pumping through your arteries is too strong. If this condition is not controlled, it may put you at risk for serious complications. Your personal target blood pressure may vary depending on your medical conditions, your age, and other factors. For most people, a normal blood pressure is less than 120/80. Hypertension is managed by lifestyle changes, medicines, or both. Lifestyle changes to help manage hypertension include losing weight, eating a healthy, low-sodium diet, exercising more, stopping smoking, and limiting alcohol. This information is not intended to replace advice given to you by your health care provider. Make sure you discuss any questions you have with your health care provider. Document Revised: 05/27/2019 Document Reviewed: 04/09/2019 Elsevier Patient Education  2022 Elsevier Inc.     Signed,   Meredith Staggers, MD [facility], [facility] 04/21/21 8:49 AM

## 2021-04-21 NOTE — Patient Instructions (Addendum)
Continue to avoid added salt if possible.  No change in meds today, but if your blood pressure is in the 140's more often, let me know and we may need to add medication.  Keep up the good work with exercise.    [place] is now offering the bivalent Covid booster vaccine. To schedule an appointment or to find more information, visit [email or web address]. You can also call [phone], Monday through Friday, 7 a.m. to 7 p.m. That can also be given at your pharmacy.    Preventive Care 51-61 Years Old, Male Preventive care refers to lifestyle choices and visits with your health care provider that can promote health and wellness. Preventive care visits are also called wellness exams. What can I expect for my preventive care visit? Counseling During your preventive care visit, your health care provider may ask about your: Medical history, including: Past medical problems. Family medical history. Current health, including: Emotional well-being. Home life and relationship well-being. Sexual activity. Lifestyle, including: Alcohol, nicotine or tobacco, and drug use. Access to firearms. Diet, exercise, and sleep habits. Safety issues such as seatbelt and bike helmet use. Sunscreen use. Work and work Astronomer. Physical exam Your health care provider will check your: Height and weight. These may be used to calculate your BMI (body mass index). BMI is a measurement that tells if you are at a healthy weight. Waist circumference. This measures the distance around your waistline. This measurement also tells if you are at a healthy weight and may help predict your risk of certain diseases, such as type 2 diabetes and high blood pressure. Heart rate and blood pressure. Body temperature. Skin for abnormal spots. What immunizations do I need? Vaccines are usually given at various ages, according to a schedule. Your health care provider will recommend vaccines for you based on your age, medical  history, and lifestyle or other factors, such as travel or where you work. What tests do I need? Screening Your health care provider may recommend screening tests for certain conditions. This may include: Lipid and cholesterol levels. Diabetes screening. This is done by checking your blood sugar (glucose) after you have not eaten for a while (fasting). Hepatitis B test. Hepatitis C test. HIV (human immunodeficiency virus) test. STI (sexually transmitted infection) testing, if you are at risk. Lung cancer screening. Prostate cancer screening. Colorectal cancer screening. Talk with your health care provider about your test results, treatment options, and if necessary, the need for more tests. Follow these instructions at home: Eating and drinking  Eat a diet that includes fresh fruits and vegetables, whole grains, lean protein, and low-fat dairy products. Take vitamin and mineral supplements as recommended by your health care provider. Do not drink alcohol if your health care provider tells you not to drink. If you drink alcohol: Limit how much you have to 0-2 drinks a day. Know how much alcohol is in your drink. In the [place] one drink equals one 12 oz bottle of beer (355 mL), one 5 oz glass of wine (148 mL), or one 1 oz glass of hard liquor (44 mL). Lifestyle Brush your teeth every morning and night with fluoride toothpaste. Floss one time each day. Exercise for at least 30 minutes 5 or more days each week. Do not use any products that contain nicotine or tobacco. These products include cigarettes, chewing tobacco, and vaping devices, such as e-cigarettes. If you need help quitting, ask your health care provider. Do not use drugs. If you are sexually active, practice safe sex.  Use a condom or other form of protection to prevent STIs. Take aspirin only as told by your health care provider. Make sure that you understand how much to take and what form to take. Work with your health care  provider to find out whether it is safe and beneficial for you to take aspirin daily. Find healthy ways to manage stress, such as: Meditation, yoga, or listening to music. Journaling. Talking to a trusted person. Spending time with friends and family. Minimize exposure to UV radiation to reduce your risk of skin cancer. Safety Always wear your seat belt while driving or riding in a vehicle. Do not drive: If you have been drinking alcohol. Do not ride with someone who has been drinking. When you are tired or distracted. While texting. If you have been using any mind-altering substances or drugs. Wear a helmet and other protective equipment during sports activities. If you have firearms in your house, make sure you follow all gun safety procedures. What's next? Go to your health care provider once a year for an annual wellness visit. Ask your health care provider how often you should have your eyes and teeth checked. Stay up to date on all vaccines. This information is not intended to replace advice given to you by your health care provider. Make sure you discuss any questions you have with your health care provider. Document Revised: 11/04/2020 Document Reviewed: 11/04/2020 Elsevier Patient Education  2022 [employer]   Managing Your Hypertension Hypertension, also called high blood pressure, is when the force of the blood pressing against the walls of the arteries is too strong. Arteries are blood vessels that carry blood from your heart throughout your body. Hypertension forces the heart to work harder to pump blood and may cause the arteries to become narrow or stiff. Understanding blood pressure readings Your personal target blood pressure may vary depending on your medical conditions, your age, and other factors. A blood pressure reading includes a higher number over a lower number. Ideally, your blood pressure should be below 120/80. You should know that: The first, or top,  number is called the systolic pressure. It is a measure of the pressure in your arteries as your heart beats. The second, or bottom number, is called the diastolic pressure. It is a measure of the pressure in your arteries as the heart relaxes. Blood pressure is classified into four stages. Based on your blood pressure reading, your health care provider may use the following stages to determine what type of treatment you need, if any. Systolic pressure and diastolic pressure are measured in a unit called mmHg. Normal Systolic pressure: below 120. Diastolic pressure: below 80. Elevated Systolic pressure: 120-129. Diastolic pressure: below 80. Hypertension stage 1 Systolic pressure: 130-139. Diastolic pressure: 80-89. Hypertension stage 2 Systolic pressure: 140 or above. Diastolic pressure: 90 or above. How can this condition affect me? Managing your hypertension is an important responsibility. Over time, hypertension can damage the arteries and decrease blood flow to important parts of the body, including the brain, heart, and kidneys. Having untreated or uncontrolled hypertension can lead to: A heart attack. A stroke. A weakened blood vessel (aneurysm). Heart failure. Kidney damage. Eye damage. Metabolic syndrome. Memory and concentration problems. Vascular dementia. What actions can I take to manage this condition? Hypertension can be managed by making lifestyle changes and possibly by taking medicines. Your health care provider will help you make a plan to bring your blood pressure within a normal range. Nutrition  Eat a  diet that is high in fiber and potassium, and low in salt (sodium), added sugar, and fat. An example eating plan is called the Dietary Approaches to Stop Hypertension (DASH) diet. To eat this way: Eat plenty of fresh fruits and vegetables. Try to fill one-half of your plate at each meal with fruits and vegetables. Eat whole grains, such as whole-wheat pasta, brown  rice, or whole-grain bread. Fill about one-fourth of your plate with whole grains. Eat low-fat dairy products. Avoid fatty cuts of meat, processed or cured meats, and poultry with skin. Fill about one-fourth of your plate with lean proteins such as fish, chicken without skin, beans, eggs, and tofu. Avoid pre-made and processed foods. These tend to be higher in sodium, added sugar, and fat. Reduce your daily sodium intake. Most people with hypertension should eat less than 1,500 mg of sodium a day. Lifestyle  Work with your health care provider to maintain a healthy body weight or to lose weight. Ask what an ideal weight is for you. Get at least 30 minutes of exercise that causes your heart to beat faster (aerobic exercise) most days of the week. Activities may include walking, swimming, or biking. Include exercise to strengthen your muscles (resistance exercise), such as weight lifting, as part of your weekly exercise routine. Try to do these types of exercises for 30 minutes at least 3 days a week. Do not use any products that contain nicotine or tobacco, such as cigarettes, e-cigarettes, and chewing tobacco. If you need help quitting, ask your health care provider. Control any long-term (chronic) conditions you have, such as high cholesterol or diabetes. Identify your sources of stress and find ways to manage stress. This may include meditation, deep breathing, or making time for fun activities. Alcohol use Do not drink alcohol if: Your health care provider tells you not to drink. You are pregnant, may be pregnant, or are planning to become pregnant. If you drink alcohol: Limit how much you use to: 0-1 drink a day for women. 0-2 drinks a day for men. Be aware of how much alcohol is in your drink. In the [place] one drink equals one 12 oz bottle of beer (355 mL), one 5 oz glass of wine (148 mL), or one 1 oz glass of hard liquor (44 mL). Medicines Your health care provider may prescribe  medicine if lifestyle changes are not enough to get your blood pressure under control and if: Your systolic blood pressure is 130 or higher. Your diastolic blood pressure is 80 or higher. Take medicines only as told by your health care provider. Follow the directions carefully. Blood pressure medicines must be taken as told by your health care provider. The medicine does not work as well when you skip doses. Skipping doses also puts you at risk for problems. Monitoring Before you monitor your blood pressure: Do not smoke, drink caffeinated beverages, or exercise within 30 minutes before taking a measurement. Use the bathroom and empty your bladder (urinate). Sit quietly for at least 5 minutes before taking measurements. Monitor your blood pressure at home as told by your health care provider. To do this: Sit with your back straight and supported. Place your feet flat on the floor. Do not cross your legs. Support your arm on a flat surface, such as a table. Make sure your upper arm is at heart level. Each time you measure, take two or three readings one minute apart and record the results. You may also need to have your blood pressure  checked regularly by your health care provider. General information Talk with your health care provider about your diet, exercise habits, and other lifestyle factors that may be contributing to hypertension. Review all the medicines you take with your health care provider because there may be side effects or interactions. Keep all visits as told by your health care provider. Your health care provider can help you create and adjust your plan for managing your high blood pressure. Where to find more information National Heart, Lung, and Blood Institute: [employer] American Heart Association: www.heart.org Contact a health care provider if: You think you are having a reaction to medicines you have taken. You have repeated (recurrent) headaches. You feel  dizzy. You have swelling in your ankles. You have trouble with your vision. Get help right away if: You develop a severe headache or confusion. You have unusual weakness or numbness, or you feel faint. You have severe pain in your chest or abdomen. You vomit repeatedly. You have trouble breathing. These symptoms may represent a serious problem that is an emergency. Do not wait to see if the symptoms will go away. Get medical help right away. Call your local emergency services (911 in the [place] Do not drive yourself to the hospital. Summary Hypertension is when the force of blood pumping through your arteries is too strong. If this condition is not controlled, it may put you at risk for serious complications. Your personal target blood pressure may vary depending on your medical conditions, your age, and other factors. For most people, a normal blood pressure is less than 120/80. Hypertension is managed by lifestyle changes, medicines, or both. Lifestyle changes to help manage hypertension include losing weight, eating a healthy, low-sodium diet, exercising more, stopping smoking, and limiting alcohol. This information is not intended to replace advice given to you by your health care provider. Make sure you discuss any questions you have with your health care provider. Document Revised: 05/27/2019 Document Reviewed: 04/09/2019 Elsevier Patient Education  2022 [employer].

## 2021-10-20 ENCOUNTER — Ambulatory Visit: Payer: Managed Care, Other (non HMO) | Admitting: Family Medicine

## 2021-10-21 ENCOUNTER — Encounter: Payer: Self-pay | Admitting: Family Medicine

## 2021-10-21 ENCOUNTER — Ambulatory Visit: Payer: 59 | Admitting: Family Medicine

## 2021-10-21 VITALS — BP 128/76 | HR 63 | Temp 97.9°F | Resp 16 | Ht 68.0 in | Wt 181.8 lb

## 2021-10-21 DIAGNOSIS — I10 Essential (primary) hypertension: Secondary | ICD-10-CM | POA: Diagnosis not present

## 2021-10-21 DIAGNOSIS — R7303 Prediabetes: Secondary | ICD-10-CM | POA: Diagnosis not present

## 2021-10-21 DIAGNOSIS — E785 Hyperlipidemia, unspecified: Secondary | ICD-10-CM

## 2021-10-21 LAB — COMPREHENSIVE METABOLIC PANEL
ALT: 21 U/L (ref 0–53)
AST: 24 U/L (ref 0–37)
Albumin: 4.2 g/dL (ref 3.5–5.2)
Alkaline Phosphatase: 51 U/L (ref 39–117)
BUN: 16 mg/dL (ref 6–23)
CO2: 30 mEq/L (ref 19–32)
Calcium: 9.5 mg/dL (ref 8.4–10.5)
Chloride: 104 mEq/L (ref 96–112)
Creatinine, Ser: 1.18 mg/dL (ref 0.40–1.50)
GFR: 70.99 mL/min (ref >60.00)
Glucose, Bld: 71 mg/dL (ref 70–99)
Potassium: 3.8 mEq/L (ref 3.5–5.1)
Sodium: 141 mEq/L (ref 135–145)
Total Bilirubin: 0.5 mg/dL (ref 0.2–1.2)
Total Protein: 7.3 g/dL (ref 6.0–8.3)

## 2021-10-21 LAB — LIPID PANEL
Cholesterol: 237 mg/dL — ABNORMAL HIGH (ref 0–200)
HDL: 64.9 mg/dL (ref >39.00)
LDL Cholesterol: 158 mg/dL — ABNORMAL HIGH (ref 0–99)
NonHDL: 172.35
Total CHOL/HDL Ratio: 4
Triglycerides: 74 mg/dL (ref 0.0–149.0)
VLDL: 14.8 mg/dL (ref 0.0–40.0)

## 2021-10-21 LAB — HEMOGLOBIN A1C: Hgb A1c MFr Bld: 6 % (ref 4.6–6.5)

## 2021-10-21 MED ORDER — HYDROCHLOROTHIAZIDE 25 MG PO TABS: 25.0000 mg | ORAL_TABLET | Freq: Every day | ORAL | 1 refills | Status: DC

## 2021-10-21 MED ORDER — AMLODIPINE BESYLATE 10 MG PO TABS: 10.0000 mg | ORAL_TABLET | Freq: Every day | ORAL | 1 refills | Status: DC

## 2021-10-21 NOTE — Patient Instructions (Addendum)
No change in BP meds for now. If readings increase, let me know.  Continue to watch diet, exercise. If cholesterol still high, I would consider a low dose of cholesterol med. I will let you know. Take care!  How to Take Your Blood Pressure Blood pressure is a measurement of how strongly your blood is pressing against the walls of your arteries. Arteries are blood vessels that carry blood from your heart throughout your body. Your health care provider takes your blood pressure at each office visit. You can also take your own blood pressure at home with a blood pressure monitor. You may need to take your own blood pressure to: Confirm a diagnosis of high blood pressure (hypertension). Monitor your blood pressure over time. Make sure your blood pressure medicine is working. Supplies needed: Blood pressure monitor. A chair to sit in. This should be a chair where you can sit upright with your back supported. Do not sit on a soft couch or an armchair. Table or desk. Small notebook and pencil or pen. How to prepare To get the most accurate reading, avoid the following for 30 minutes before you check your blood pressure: Drinking caffeine. Drinking alcohol. Eating. Smoking. Exercising. Five minutes before you check your blood pressure: Use the bathroom and urinate so that you have an empty bladder. Sit quietly in a chair. Do not talk. How to take your blood pressure To check your blood pressure, follow the instructions in the manual that came with your blood pressure monitor. If you have a digital blood pressure monitor, the instructions may be as follows: Sit up straight in a chair. Place your feet on the floor. Do not cross your ankles or legs. Rest your left arm at the level of your heart on a table or desk or on the arm of a chair. Pull up your shirt sleeve. Wrap the blood pressure cuff around the upper part of your left arm, 1 inch (2.5 cm) above your elbow. It is best to wrap the cuff  around bare skin. Fit the cuff snugly, but not too tightly, around your arm. You should be able to place only one finger between the cuff and your arm. Position the cord so that it rests in the bend of your elbow. Press the power button. Sit quietly while the cuff inflates and deflates. Read the digital reading on the monitor screen and write the numbers down (record them) in a notebook. Wait 2-3 minutes, then repeat the steps, starting at step 1. What does my blood pressure reading mean? A blood pressure reading consists of a higher number over a lower number. Ideally, your blood pressure should be below 120/80. The first ("top") number is called the systolic pressure. It is a measure of the pressure in your arteries as your heart beats. The second ("bottom") number is called the diastolic pressure. It is a measure of the pressure in your arteries as the heart relaxes. Blood pressure is classified into four stages. The following are the stages for adults who do not have a short-term serious illness or a chronic condition. Systolic pressure and diastolic pressure are measured in a unit called mm Hg (millimeters of mercury).  Normal Systolic pressure: below 120. Diastolic pressure: below 80. Elevated Systolic pressure: 120-129. Diastolic pressure: below 80. Hypertension stage 1 Systolic pressure: 130-139. Diastolic pressure: 80-89. Hypertension stage 2 Systolic pressure: 140 or above. Diastolic pressure: 90 or above. You can have elevated blood pressure or hypertension even if only the systolic or  only the diastolic number in your reading is higher than normal. Follow these instructions at home: Medicines Take over-the-counter and prescription medicines only as told by your health care provider. Tell your health care provider if you are having any side effects from blood pressure medicine. General instructions Check your blood pressure as often as recommended by your health care  provider. Check your blood pressure at the same time every day. Take your monitor to the next appointment with your health care provider to make sure that: You are using it correctly. It provides accurate readings. Understand what your goal blood pressure numbers are. Keep all follow-up visits. This is important. General tips Your health care provider can suggest a reliable monitor that will meet your needs. There are several types of home blood pressure monitors. Choose a monitor that has an arm cuff. Do not choose a monitor that measures your blood pressure from your wrist or finger. Choose a cuff that wraps snugly, not too tight or too loose, around your upper arm. You should be able to fit only one finger between your arm and the cuff. You can buy a blood pressure monitor at most drugstores or online. Where to find more information American Heart Association: www.heart.org Contact a health care provider if: Your blood pressure is consistently high. Your blood pressure is suddenly low. Get help right away if: Your systolic blood pressure is higher than 180. Your diastolic blood pressure is higher than 120. These symptoms may be an emergency. Get help right away. Call 911. Do not wait to see if the symptoms will go away. Do not drive yourself to the hospital. Summary Blood pressure is a measurement of how strongly your blood is pressing against the walls of your arteries. A blood pressure reading consists of a higher number over a lower number. Ideally, your blood pressure should be below 120/80. Check your blood pressure at the same time every day. Avoid caffeine, alcohol, smoking, and exercise for 30 minutes prior to checking your blood pressure. These agents can affect the accuracy of the blood pressure reading. This information is not intended to replace advice given to you by your health care provider. Make sure you discuss any questions you have with your health care  provider. Document Revised: 01/21/2021 Document Reviewed: 01/21/2021 Elsevier Patient Education  2023 [employer].

## 2021-10-21 NOTE — Progress Notes (Signed)
Subjective:  Patient ID: Dakota Bowman, male    DOB: [birth year]  Age: 51 y.o. MRN: [id number]  CC:  Chief Complaint  Patient presents with   Hyperlipidemia    Pt presents for a 6 month follow up. Pt was advised to start statin.   Hypertension    HPI Dakota Bowman presents for   Hypertension: Norvasc 12m qd. Np new side effects.  Home readings: 120/80's, 138/92, 138/91, 123/83, 130/89.   BP Readings from Last 3 Encounters:  10/21/21 128/76  04/21/21 134/80  10/12/20 134/66   Lab Results  Component Value Date   CREATININE 1.20 04/21/2021    Prediabetes: Weight improved since November. Exercising 3 days per week.  No regular soda/sweet tea or fast food.  Lab Results  Component Value Date   HGBA1C 6.2 04/21/2021   Wt Readings from Last 3 Encounters:  10/21/21 181 lb 12.8 oz (82.5 kg)  04/21/21 188 lb 6.4 oz (85.5 kg)  10/12/20 179 lb 9.6 oz (81.5 kg)    Hyperlipidemia: Borderline ASCVD risk score previously, diet/exercise approach initially planned.  Last labs in November, with higher LDL and total cholesterol.  Recommended low-dose statin, but did not hear back from patient. No FH heart disease.  Agrees to statin if needed and potential risks and side effects discussed.  The 10-year ASCVD risk score (Arnett DK, et al., 2019) is: 9.5%   Values used to calculate the score:     Age: [age]years     Sex: Male     Is Non-Hispanic African American: Yes     Diabetic: No     Tobacco smoker: No     Systolic Blood Pressure: 1563mmHg     Is BP treated: Yes     HDL Cholesterol: 62.9 mg/dL     Total Cholesterol: 240 mg/dL   Lab Results  Component Value Date   CHOL 240 (H) 04/21/2021   HDL 62.90 04/21/2021   LDLCALC 161 (H) 04/21/2021   TRIG 77.0 04/21/2021   CHOLHDL 4 04/21/2021   Lab Results  Component Value Date   ALT 14 04/21/2021   AST 20 04/21/2021   ALKPHOS 50 04/21/2021   BILITOT 0.4 04/21/2021    History Patient Active Problem List   Diagnosis  Date Noted   Essential hypertension 02/09/2015   Hyperlipidemia 02/09/2015   Health care maintenance 10/20/2011   Tinea pedis 10/20/2011   Past Medical History:  Diagnosis Date   Allergy    seasonal   Essential hypertension 02/09/2015   Hyperlipidemia 02/09/2015   Past Surgical History:  Procedure Laterality Date   NASAL SINUS SURGERY  2005   Allergies  Allergen Reactions   Latex    Prior to Admission medications   Medication Sig Start Date End Date Taking? Authorizing Provider  amLODipine (NORVASC) 10 MG tablet Take 1 tablet (10 mg total) by mouth daily. 04/21/21  Yes GWendie Agreste MD  clobetasol ointment (TEMOVATE) 0.05 %  06/18/19  Yes [provider]  clotrimazole (LOTRIMIN) 1 % cream Apply 1 application topically 2 (two) times daily. Between toes. 09/04/19  Yes GWendie Agreste MD  hydrochlorothiazide (HYDRODIURIL) 25 MG tablet Take 1 tablet (25 mg total) by mouth daily. 04/21/21  Yes GWendie Agreste MD   Social History   Socioeconomic History   Marital status: Married    Spouse name: Not on file   Number of children: Not on file   Years of education: Not on file   Highest education level: Not  on file  Occupational History   Not on file  Tobacco Use   Smoking status: Never   Smokeless tobacco: Never  Vaping Use   Vaping Use: Never used  Substance and Sexual Activity   Alcohol use: No    Alcohol/week: 0.0 standard drinks   Drug use: No   Sexual activity: Yes  Other Topics Concern   Not on file  Social History Narrative   Not on file   Social Determinants of Health   Financial Resource Strain: Not on file  Food Insecurity: Not on file  Transportation Needs: Not on file  Physical Activity: Not on file  Stress: Not on file  Social Connections: Not on file  Intimate Partner Violence: Not on file    Review of Systems  Constitutional:  Negative for fatigue and unexpected weight change.  Eyes:  Negative for visual disturbance.   Respiratory:  Negative for cough, chest tightness and shortness of breath.   Cardiovascular:  Negative for chest pain, palpitations and leg swelling.  Gastrointestinal:  Negative for abdominal pain and blood in stool.  Neurological:  Negative for dizziness, light-headedness and headaches.    Objective:   Vitals:   10/21/21 0946  BP: 128/76  Pulse: 63  Resp: 16  Temp: 97.9 F (36.6 C)  TempSrc: Temporal  SpO2: 98%  Weight: 181 lb 12.8 oz (82.5 kg)  Height: 5' 8"  (1.727 m)     Physical Exam Vitals reviewed.  Constitutional:      Appearance: He is well-developed.  HENT:     Head: Normocephalic and atraumatic.  Neck:     Vascular: No carotid bruit or JVD.  Cardiovascular:     Rate and Rhythm: Normal rate and regular rhythm.     Heart sounds: Normal heart sounds. No murmur heard. Pulmonary:     Effort: Pulmonary effort is normal.     Breath sounds: Normal breath sounds. No rales.  Musculoskeletal:     Right lower leg: No edema.     Left lower leg: No edema.  Skin:    General: Skin is warm and dry.  Neurological:     Mental Status: He is alert and oriented to person, place, and time.  Psychiatric:        Mood and Affect: Mood normal.       Assessment & Plan:  [name] is a 51 y.o. male . Essential hypertension - Plan: amLODipine (NORVASC) 10 MG tablet, hydrochlorothiazide (HYDRODIURIL) 25 MG tablet, Comp Met (CMET)  -  Stable, tolerating current regimen. Medications refilled. Labs pending as above.   Hyperlipidemia, unspecified hyperlipidemia type - Plan: Comp Met (CMET), Lipid panel  - consider statin - updated labs then ascvd score to decide.   Prediabetes - Plan: Hemoglobin A1c  - diet/exercise approach updated labs.   Meds ordered this encounter  Medications   amLODipine (NORVASC) 10 MG tablet    Sig: Take 1 tablet (10 mg total) by mouth daily.    Dispense:  90 tablet    Refill:  1   hydrochlorothiazide (HYDRODIURIL) 25 MG tablet    Sig: Take  1 tablet (25 mg total) by mouth daily.    Dispense:  90 tablet    Refill:  1   Patient Instructions  No change in BP meds for now. If readings increase, let me know.  Continue to watch diet, exercise. If cholesterol still high, I would consider a low dose of cholesterol med. I will let you know. Take care!  How to  Take Your Blood Pressure Blood pressure is a measurement of how strongly your blood is pressing against the walls of your arteries. Arteries are blood vessels that carry blood from your heart throughout your body. Your health care provider takes your blood pressure at each office visit. You can also take your own blood pressure at home with a blood pressure monitor. You may need to take your own blood pressure to: Confirm a diagnosis of high blood pressure (hypertension). Monitor your blood pressure over time. Make sure your blood pressure medicine is working. Supplies needed: Blood pressure monitor. A chair to sit in. This should be a chair where you can sit upright with your back supported. Do not sit on a soft couch or an armchair. Table or desk. Small notebook and pencil or pen. How to prepare To get the most accurate reading, avoid the following for 30 minutes before you check your blood pressure: Drinking caffeine. Drinking alcohol. Eating. Smoking. Exercising. Five minutes before you check your blood pressure: Use the bathroom and urinate so that you have an empty bladder. Sit quietly in a chair. Do not talk. How to take your blood pressure To check your blood pressure, follow the instructions in the manual that came with your blood pressure monitor. If you have a digital blood pressure monitor, the instructions may be as follows: Sit up straight in a chair. Place your feet on the floor. Do not cross your ankles or legs. Rest your left arm at the level of your heart on a table or desk or on the arm of a chair. Pull up your shirt sleeve. Wrap the blood pressure cuff  around the upper part of your left arm, 1 inch (2.5 cm) above your elbow. It is best to wrap the cuff around bare skin. Fit the cuff snugly, but not too tightly, around your arm. You should be able to place only one finger between the cuff and your arm. Position the cord so that it rests in the bend of your elbow. Press the power button. Sit quietly while the cuff inflates and deflates. Read the digital reading on the monitor screen and write the numbers down (record them) in a notebook. Wait 2-3 minutes, then repeat the steps, starting at step 1. What does my blood pressure reading mean? A blood pressure reading consists of a higher number over a lower number. Ideally, your blood pressure should be below 120/80. The first ("top") number is called the systolic pressure. It is a measure of the pressure in your arteries as your heart beats. The second ("bottom") number is called the diastolic pressure. It is a measure of the pressure in your arteries as the heart relaxes. Blood pressure is classified into four stages. The following are the stages for adults who do not have a short-term serious illness or a chronic condition. Systolic pressure and diastolic pressure are measured in a unit called mm Hg (millimeters of mercury).  Normal Systolic pressure: below 923. Diastolic pressure: below 80. Elevated Systolic pressure: 300-762. Diastolic pressure: below 80. Hypertension stage 1 Systolic pressure: 263-335. Diastolic pressure: 45-62. Hypertension stage 2 Systolic pressure: 563 or above. Diastolic pressure: 90 or above. You can have elevated blood pressure or hypertension even if only the systolic or only the diastolic number in your reading is higher than normal. Follow these instructions at home: Medicines Take over-the-counter and prescription medicines only as told by your health care provider. Tell your health care provider if you are having any side effects  from blood pressure  medicine. General instructions Check your blood pressure as often as recommended by your health care provider. Check your blood pressure at the same time every day. Take your monitor to the next appointment with your health care provider to make sure that: You are using it correctly. It provides accurate readings. Understand what your goal blood pressure numbers are. Keep all follow-up visits. This is important. General tips Your health care provider can suggest a reliable monitor that will meet your needs. There are several types of home blood pressure monitors. Choose a monitor that has an arm cuff. Do not choose a monitor that measures your blood pressure from your wrist or finger. Choose a cuff that wraps snugly, not too tight or too loose, around your upper arm. You should be able to fit only one finger between your arm and the cuff. You can buy a blood pressure monitor at most drugstores or online. Where to find more information American Heart Association: www.heart.org Contact a health care provider if: Your blood pressure is consistently high. Your blood pressure is suddenly low. Get help right away if: Your systolic blood pressure is higher than 180. Your diastolic blood pressure is higher than 120. These symptoms may be an emergency. Get help right away. Call 911. Do not wait to see if the symptoms will go away. Do not drive yourself to the hospital. Summary Blood pressure is a measurement of how strongly your blood is pressing against the walls of your arteries. A blood pressure reading consists of a higher number over a lower number. Ideally, your blood pressure should be below 120/80. Check your blood pressure at the same time every day. Avoid caffeine, alcohol, smoking, and exercise for 30 minutes prior to checking your blood pressure. These agents can affect the accuracy of the blood pressure reading. This information is not intended to replace advice given to you by your  health care provider. Make sure you discuss any questions you have with your health care provider. Document Revised: 01/21/2021 Document Reviewed: 01/21/2021 Elsevier Patient Education  2023 [place],   Merri Ray, MD [place], [facility] 10/21/21 10:36 AM

## 2022-04-28 ENCOUNTER — Encounter: Payer: 59 | Admitting: Family Medicine

## 2022-05-05 ENCOUNTER — Encounter: Payer: Self-pay | Admitting: Family Medicine

## 2022-05-05 ENCOUNTER — Ambulatory Visit (INDEPENDENT_AMBULATORY_CARE_PROVIDER_SITE_OTHER): Payer: 59 | Admitting: Family Medicine

## 2022-05-05 VITALS — BP 138/78 | HR 59 | Temp 98.1°F | Ht 68.0 in | Wt 180.2 lb

## 2022-05-05 DIAGNOSIS — I10 Essential (primary) hypertension: Secondary | ICD-10-CM | POA: Diagnosis not present

## 2022-05-05 DIAGNOSIS — Z0000 Encounter for general adult medical examination without abnormal findings: Secondary | ICD-10-CM

## 2022-05-05 DIAGNOSIS — Z23 Encounter for immunization: Secondary | ICD-10-CM

## 2022-05-05 DIAGNOSIS — E785 Hyperlipidemia, unspecified: Secondary | ICD-10-CM

## 2022-05-05 DIAGNOSIS — R7303 Prediabetes: Secondary | ICD-10-CM

## 2022-05-05 DIAGNOSIS — Z1159 Encounter for screening for other viral diseases: Secondary | ICD-10-CM

## 2022-05-05 LAB — COMPREHENSIVE METABOLIC PANEL
ALT: 16 U/L (ref 0–53)
AST: 23 U/L (ref 0–37)
Albumin: 4.3 g/dL (ref 3.5–5.2)
Alkaline Phosphatase: 43 U/L (ref 39–117)
BUN: 12 mg/dL (ref 6–23)
CO2: 32 mEq/L (ref 19–32)
Calcium: 9.5 mg/dL (ref 8.4–10.5)
Chloride: 104 mEq/L (ref 96–112)
Creatinine, Ser: 1.19 mg/dL (ref 0.40–1.50)
GFR: 70.01 mL/min (ref >60.00)
Glucose, Bld: 82 mg/dL (ref 70–99)
Potassium: 3.9 mEq/L (ref 3.5–5.1)
Sodium: 139 mEq/L (ref 135–145)
Total Bilirubin: 0.5 mg/dL (ref 0.2–1.2)
Total Protein: 7.4 g/dL (ref 6.0–8.3)

## 2022-05-05 LAB — LIPID PANEL
Cholesterol: 212 mg/dL — ABNORMAL HIGH (ref 0–200)
HDL: 67.6 mg/dL (ref >39.00)
LDL Cholesterol: 130 mg/dL — ABNORMAL HIGH (ref 0–99)
NonHDL: 144.46
Total CHOL/HDL Ratio: 3
Triglycerides: 70 mg/dL (ref 0.0–149.0)
VLDL: 14 mg/dL (ref 0.0–40.0)

## 2022-05-05 LAB — HEMOGLOBIN A1C: Hgb A1c MFr Bld: 6.2 % (ref 4.6–6.5)

## 2022-05-05 MED ORDER — AMLODIPINE BESYLATE 10 MG PO TABS: 10.0000 mg | ORAL_TABLET | Freq: Every day | ORAL | 3 refills | Status: DC

## 2022-05-05 MED ORDER — HYDROCHLOROTHIAZIDE 25 MG PO TABS: 25.0000 mg | ORAL_TABLET | Freq: Every day | ORAL | 3 refills | Status: DC

## 2022-05-05 NOTE — Patient Instructions (Signed)
Patient recommended today.  No medication changes at this time.  I will check the screening labs as we discussed including cholesterol.  If that is still elevated, I would consider a low-dose cholesterol medicine even a few days per week or we can order the coronary calcium scoring test to help decide on use of cholesterol medication.  As we discussed that typically is out-of-pocket, around $100. Flu vaccine today, let me know if you would like to have shingles vaccine and we can schedule a nurse visit.  40-month follow-up.  Let me know if you have any questions in the meantime and take care!  Preventive Care 52-37 Years Old, Male Preventive care refers to lifestyle choices and visits with your health care provider that can promote health and wellness. Preventive care visits are also called wellness exams. What can I expect for my preventive care visit? Counseling During your preventive care visit, your health care provider may ask about your: Medical history, including: Past medical problems. Family medical history. Current health, including: Emotional well-being. Home life and relationship well-being. Sexual activity. Lifestyle, including: Alcohol, nicotine or tobacco, and drug use. Access to firearms. Diet, exercise, and sleep habits. Safety issues such as seatbelt and bike helmet use. Sunscreen use. Work and work Astronomer. Physical exam Your health care provider will check your: Height and weight. These may be used to calculate your BMI (body mass index). BMI is a measurement that tells if you are at a healthy weight. Waist circumference. This measures the distance around your waistline. This measurement also tells if you are at a healthy weight and may help predict your risk of certain diseases, such as type 2 diabetes and high blood pressure. Heart rate and blood pressure. Body temperature. Skin for abnormal spots. What immunizations do I need?  Vaccines are usually given at  various ages, according to a schedule. Your health care provider will recommend vaccines for you based on your age, medical history, and lifestyle or other factors, such as travel or where you work. What tests do I need? Screening Your health care provider may recommend screening tests for certain conditions. This may include: Lipid and cholesterol levels. Diabetes screening. This is done by checking your blood sugar (glucose) after you have not eaten for a while (fasting). Hepatitis B test. Hepatitis C test. HIV (human immunodeficiency virus) test. STI (sexually transmitted infection) testing, if you are at risk. Lung cancer screening. Prostate cancer screening. Colorectal cancer screening. Talk with your health care provider about your test results, treatment options, and if necessary, the need for more tests. Follow these instructions at home: Eating and drinking  Eat a diet that includes fresh fruits and vegetables, whole grains, lean protein, and low-fat dairy products. Take vitamin and mineral supplements as recommended by your health care provider. Do not drink alcohol if your health care provider tells you not to drink. If you drink alcohol: Limit how much you have to 0-2 drinks a day. Know how much alcohol is in your drink. In the [place] one drink equals one 12 oz bottle of beer (355 mL), one 5 oz glass of wine (148 mL), or one 1 oz glass of hard liquor (44 mL). Lifestyle Brush your teeth every morning and night with fluoride toothpaste. Floss one time each day. Exercise for at least 30 minutes 5 or more days each week. Do not use any products that contain nicotine or tobacco. These products include cigarettes, chewing tobacco, and vaping devices, such as e-cigarettes. If you need  help quitting, ask your health care provider. Do not use drugs. If you are sexually active, practice safe sex. Use a condom or other form of protection to prevent STIs. Take aspirin only as told by  your health care provider. Make sure that you understand how much to take and what form to take. Work with your health care provider to find out whether it is safe and beneficial for you to take aspirin daily. Find healthy ways to manage stress, such as: Meditation, yoga, or listening to music. Journaling. Talking to a trusted person. Spending time with friends and family. Minimize exposure to UV radiation to reduce your risk of skin cancer. Safety Always wear your seat belt while driving or riding in a vehicle. Do not drive: If you have been drinking alcohol. Do not ride with someone who has been drinking. When you are tired or distracted. While texting. If you have been using any mind-altering substances or drugs. Wear a helmet and other protective equipment during sports activities. If you have firearms in your house, make sure you follow all gun safety procedures. What's next? Go to your health care provider once a year for an annual wellness visit. Ask your health care provider how often you should have your eyes and teeth checked. Stay up to date on all vaccines. This information is not intended to replace advice given to you by your health care provider. Make sure you discuss any questions you have with your health care provider. Document Revised: 11/04/2020 Document Reviewed: 11/04/2020 Elsevier Patient Education  2023 [employer].

## 2022-05-05 NOTE — Progress Notes (Signed)
Subjective:  Patient ID: Dakota Bowman, male    DOB: 1970/08/29  Age: 51 y.o. MRN: [id number]  CC:  Chief Complaint  Patient presents with   Annual Exam    Pt states all is well    HPI Dakota Bowman presents for Annual Exam  Hypertension: Amlodipine 10 mg daily, hydrochlorothiazide 25 mg daily. Home readings: 120/79,140/80.  BP Readings from Last 3 Encounters:  05/05/22 138/78  10/21/21 128/76  04/21/21 134/80   Lab Results  Component Value Date   CREATININE 1.18 10/21/2021   Prediabetes: Weight improving at his last visit with exercise. Still at gym 3-4 days per week. No soda. Only occasional tea.  Lab Results  Component Value Date   HGBA1C 6.0 10/21/2021   Wt Readings from Last 3 Encounters:  05/05/22 180 lb 3.2 oz (81.7 kg)  10/21/21 181 lb 12.8 oz (82.5 kg)  04/21/21 188 lb 6.4 oz (85.5 kg)    Hyperlipidemia: Diet/exercise approach.  Borderline ASCVD risk score previously.  Discussed intermittent statin dosing, low-dose statin after labs from last visit.  No current meds. Option of CCS testing if borderline.  No FH of MI/CVA.  The 10-year ASCVD risk score (Arnett DK, et al., 2019) is: 10.7%   Values used to calculate the score:     Age: 39 years     Sex: Male     Is Non-Hispanic African American: Yes     Diabetic: No     Tobacco smoker: No     Systolic Blood Pressure: 138 mmHg     Is BP treated: Yes     HDL Cholesterol: 64.9 mg/dL     Total Cholesterol: 237 mg/dL  Lab Results  Component Value Date   CHOL 237 (H) 10/21/2021   HDL 64.90 10/21/2021   LDLCALC 158 (H) 10/21/2021   TRIG 74.0 10/21/2021   CHOLHDL 4 10/21/2021   Lab Results  Component Value Date   ALT 21 10/21/2021   AST 24 10/21/2021   ALKPHOS 51 10/21/2021   BILITOT 0.5 10/21/2021          05/05/2022    8:41 AM 10/21/2021    9:44 AM 04/21/2021    8:12 AM 03/11/2020    8:23 AM 09/04/2019    9:02 AM  Depression screen PHQ 2/9  Decreased Interest 0 0 0 0 0  Down, Depressed,  Hopeless 0 0 0 0 0  PHQ - 2 Score 0 0 0 0 0  Altered sleeping 0 0     Tired, decreased energy 0 0     Change in appetite 0 0     Feeling bad or failure about yourself  0 0     Trouble concentrating 0 0     Moving slowly or fidgety/restless 0 0     Suicidal thoughts 0 0     PHQ-9 Score 0 0     Difficult doing work/chores  Not difficult at all       Health Maintenance  Topic Date Due   INFLUENZA VACCINE  12/21/2021   COVID-19 Vaccine (1) 05/21/2022 (Originally 11/20/1970)   Zoster Vaccines- Shingrix (1 of 2) 08/04/2022 (Originally 05/24/2019)   Hepatitis C Screening  10/22/2022 (Originally 05/24/1987)   COLONOSCOPY (Pts 45-26yrs Insurance coverage will need to be confirmed)  10/28/2029   HIV Screening  Completed   HPV VACCINES  Aged Out   DTaP/Tdap/Td  Discontinued  Colonoscopy 10/29/2019, repeat 10 yrs.  Prostate: does not have family history of prostate cancer The natural history  of prostate cancer and ongoing controversy regarding screening and potential treatment outcomes of prostate cancer has been discussed with the patient. The meaning of a false positive PSA and a false negative PSA has been discussed. He indicates understanding of the limitations of this screening test and wishes NOT to proceed with screening PSA testing. Plan on every other year testing.  Lab Results  Component Value Date   PSA1 0.3 02/14/2017   PSA 0.30 04/21/2021   PSA 0.31 02/09/2015   Immunization History  Administered Date(s) Administered   Influenza,inj,Quad PF,6+ Mos 02/09/2016, 02/14/2017, 03/03/2018, 03/06/2019, 03/11/2020, 04/21/2021   Influenza-Unspecified 03/24/2015   Tdap 10/20/2011  Flu vaccine today.  Covid booster - recommended, declined. Had primary series.  Shingles vaccine: recommended, declined for now.  Hep C screening: agrees to test.   No results found. Saw optho earlier this year  Dental: every 6 months.   Alcohol:none  Tobacco: none  Exercise: At [employer] multiple days  per week.3-4 days.    History Patient Active Problem List   Diagnosis Date Noted   Essential hypertension 02/09/2015   Hyperlipidemia 02/09/2015   Health care maintenance 10/20/2011   Tinea pedis 10/20/2011   Past Medical History:  Diagnosis Date   Allergy    seasonal   Essential hypertension 02/09/2015   Hyperlipidemia 02/09/2015   Past Surgical History:  Procedure Laterality Date   NASAL SINUS SURGERY  2005   Allergies  Allergen Reactions   Latex    Prior to Admission medications   Medication Sig Start Date End Date Taking? Authorizing Provider  amLODipine (NORVASC) 10 MG tablet Take 1 tablet (10 mg total) by mouth daily. 10/21/21  Yes Shade Flood, MD  clobetasol ointment (TEMOVATE) 0.05 %  06/18/19  Yes [provider]  clotrimazole (LOTRIMIN) 1 % cream Apply 1 application topically 2 (two) times daily. Between toes. 09/04/19  Yes Shade Flood, MD  hydrochlorothiazide (HYDRODIURIL) 25 MG tablet Take 1 tablet (25 mg total) by mouth daily. 10/21/21  Yes Shade Flood, MD   Social History   Socioeconomic History   Marital status: Married    Spouse name: Not on file   Number of children: Not on file   Years of education: Not on file   Highest education level: Not on file  Occupational History   Not on file  Tobacco Use   Smoking status: Never   Smokeless tobacco: Never  Vaping Use   Vaping Use: Never used  Substance and Sexual Activity   Alcohol use: No    Alcohol/week: 0.0 standard drinks of alcohol   Drug use: No   Sexual activity: Yes  Other Topics Concern   Not on file  Social History Narrative   Not on file   Social Determinants of Health   Financial Resource Strain: Not on file  Food Insecurity: Not on file  Transportation Needs: Not on file  Physical Activity: Not on file  Stress: Not on file  Social Connections: Not on file  Intimate Partner Violence: Not on file    Review of Systems 13 point review of systems per patient  health survey noted.  Negative other than as indicated above or in HPI.    Objective:   Vitals:   05/05/22 0844  BP: 138/78  Pulse: (!) 59  Temp: 98.1 F (36.7 C)  SpO2: 100%  Weight: 180 lb 3.2 oz (81.7 kg)  Height:  (1.727 m)     Physical Exam Vitals reviewed.  Constitutional:  Appearance: He is well-developed.  HENT:     Head: Normocephalic and atraumatic.  Neck:     Vascular: No carotid bruit or JVD.  Cardiovascular:     Rate and Rhythm: Normal rate and regular rhythm.     Heart sounds: Normal heart sounds. No murmur heard. Pulmonary:     Effort: Pulmonary effort is normal.     Breath sounds: Normal breath sounds. No rales.  Musculoskeletal:     Right lower leg: No edema.     Left lower leg: No edema.  Skin:    General: Skin is warm and dry.  Neurological:     Mental Status: He is alert and oriented to person, place, and time.  Psychiatric:        Mood and Affect: Mood normal.        Assessment & Plan:  Dakota Bowman is a 51 y.o. male . Annual physical exam  - -anticipatory guidance as below in AVS, screening labs above. Health maintenance items as above in HPI discussed/recommended as applicable.   -Discussed vaccines, declines COVID booster, declines Shingrix at this time, but if he changes his mind, okay to schedule nurse visit for that injection.  Need for influenza vaccination - Plan: Flu Vaccine QUAD 6+ mos PF IM (Fluarix Quad PF)  Essential hypertension - Plan: amLODipine (NORVASC) 10 MG tablet, hydrochlorothiazide (HYDRODIURIL) 25 MG tablet  -  Stable, tolerating current regimen. Medications refilled. Labs pending as above.   Need for hepatitis C screening test - Plan: Hepatitis C antibody  Prediabetes - Plan: Hemoglobin A1c  -Commended on exercise, continue to watch diet, check updated A1c, 64-month follow-up.  Hyperlipidemia, unspecified hyperlipidemia type - Plan: Comprehensive metabolic panel, Lipid panel  -With slight elevated  ASCVD risk score previously.  No known family history of cardiac disease.  Repeat labs, then consider low-dose statin, intermittent statin, or coronary calcium scoring, discussed briefly in office today.  Meds ordered this encounter  Medications   amLODipine (NORVASC) 10 MG tablet    Sig: Take 1 tablet (10 mg total) by mouth daily.    Dispense:  90 tablet    Refill:  3   hydrochlorothiazide (HYDRODIURIL) 25 MG tablet    Sig: Take 1 tablet (25 mg total) by mouth daily.    Dispense:  90 tablet    Refill:  3   Patient Instructions  Patient recommended today.  No medication changes at this time.  I will check the screening labs as we discussed including cholesterol.  If that is still elevated, I would consider a low-dose cholesterol medicine even a few days per week or we can order the coronary calcium scoring test to help decide on use of cholesterol medication.  As we discussed that typically is out-of-pocket, around $100. Flu vaccine today, let me know if you would like to have shingles vaccine and we can schedule a nurse visit.  14-month follow-up.  Let me know if you have any questions in the meantime and take care!  Preventive Care 80-81 Years Old, Male Preventive care refers to lifestyle choices and visits with your health care provider that can promote health and wellness. Preventive care visits are also called wellness exams. What can I expect for my preventive care visit? Counseling During your preventive care visit, your health care provider may ask about your: Medical history, including: Past medical problems. Family medical history. Current health, including: Emotional well-being. Home life and relationship well-being. Sexual activity. Lifestyle, including: Alcohol, nicotine or tobacco, and drug  use. Access to firearms. Diet, exercise, and sleep habits. Safety issues such as seatbelt and bike helmet use. Sunscreen use. Work and work Astronomerenvironment. Physical exam Your health  care provider will check your: Height and weight. These may be used to calculate your BMI (body mass index). BMI is a measurement that tells if you are at a healthy weight. Waist circumference. This measures the distance around your waistline. This measurement also tells if you are at a healthy weight and may help predict your risk of certain diseases, such as type 2 diabetes and high blood pressure. Heart rate and blood pressure. Body temperature. Skin for abnormal spots. What immunizations do I need?  Vaccines are usually given at various ages, according to a schedule. Your health care provider will recommend vaccines for you based on your age, medical history, and lifestyle or other factors, such as travel or where you work. What tests do I need? Screening Your health care provider may recommend screening tests for certain conditions. This may include: Lipid and cholesterol levels. Diabetes screening. This is done by checking your blood sugar (glucose) after you have not eaten for a while (fasting). Hepatitis B test. Hepatitis C test. HIV (human immunodeficiency virus) test. STI (sexually transmitted infection) testing, if you are at risk. Lung cancer screening. Prostate cancer screening. Colorectal cancer screening. Talk with your health care provider about your test results, treatment options, and if necessary, the need for more tests. Follow these instructions at home: Eating and drinking  Eat a diet that includes fresh fruits and vegetables, whole grains, lean protein, and low-fat dairy products. Take vitamin and mineral supplements as recommended by your health care provider. Do not drink alcohol if your health care provider tells you not to drink. If you drink alcohol: Limit how much you have to 0-2 drinks a day. Know how much alcohol is in your drink. In the [place] one drink equals one 12 oz bottle of beer (355 mL), one 5 oz glass of wine (148 mL), or one 1 oz glass of hard  liquor (44 mL). Lifestyle Brush your teeth every morning and night with fluoride toothpaste. Floss one time each day. Exercise for at least 30 minutes 5 or more days each week. Do not use any products that contain nicotine or tobacco. These products include cigarettes, chewing tobacco, and vaping devices, such as e-cigarettes. If you need help quitting, ask your health care provider. Do not use drugs. If you are sexually active, practice safe sex. Use a condom or other form of protection to prevent STIs. Take aspirin only as told by your health care provider. Make sure that you understand how much to take and what form to take. Work with your health care provider to find out whether it is safe and beneficial for you to take aspirin daily. Find healthy ways to manage stress, such as: Meditation, yoga, or listening to music. Journaling. Talking to a trusted person. Spending time with friends and family. Minimize exposure to UV radiation to reduce your risk of skin cancer. Safety Always wear your seat belt while driving or riding in a vehicle. Do not drive: If you have been drinking alcohol. Do not ride with someone who has been drinking. When you are tired or distracted. While texting. If you have been using any mind-altering substances or drugs. Wear a helmet and other protective equipment during sports activities. If you have firearms in your house, make sure you follow all gun safety procedures. What's next? Go to your  health care provider once a year for an annual wellness visit. Ask your health care provider how often you should have your eyes and teeth checked. Stay up to date on all vaccines. This information is not intended to replace advice given to you by your health care provider. Make sure you discuss any questions you have with your health care provider. Document Revised: 11/04/2020 Document Reviewed: 11/04/2020 Elsevier Patient Education  2023 Elsevier Inc.     Signed,    Meredith Staggers, MD [facility], [facility] 05/05/22 9:17 AM

## 2022-05-06 LAB — HEPATITIS C ANTIBODY: Hepatitis C Ab: NONREACTIVE

## 2022-11-07 ENCOUNTER — Encounter: Payer: Self-pay | Admitting: Family Medicine

## 2022-11-07 ENCOUNTER — Ambulatory Visit: Payer: 59 | Admitting: Family Medicine

## 2022-11-07 VITALS — BP 128/70 | HR 57 | Temp 98.0°F | Ht 68.0 in | Wt 176.6 lb

## 2022-11-07 DIAGNOSIS — I10 Essential (primary) hypertension: Secondary | ICD-10-CM

## 2022-11-07 DIAGNOSIS — E785 Hyperlipidemia, unspecified: Secondary | ICD-10-CM | POA: Diagnosis not present

## 2022-11-07 DIAGNOSIS — R7303 Prediabetes: Secondary | ICD-10-CM | POA: Diagnosis not present

## 2022-11-07 NOTE — Progress Notes (Signed)
Subjective:  Patient ID: Dakota Bowman, male    DOB: 1971-01-12  Age: 52 y.o. MRN: [id number]  CC:  Chief Complaint  Patient presents with   Medical Management of Chronic Issues    HPI Dakota Bowman presents for follow up, no health changes.   Hypertension: Amlodipine 10 mg daily, hydrochlorothiazide 25 mg daily. No new side effects.  Home readings: 130/80.  BP Readings from Last 3 Encounters:  11/07/22 128/70  05/05/22 138/78  10/21/21 128/76   Lab Results  Component Value Date   CREATININE 1.19 05/05/2022   Prediabetes: Diet/exercise approach, weight has improved by 4 pounds since December. Gym exercise - 4-5 days per week.  Lab Results  Component Value Date   HGBA1C 6.2 05/05/2022   Wt Readings from Last 3 Encounters:  11/07/22 176 lb 9.6 oz (80.1 kg)  05/05/22 180 lb 3.2 oz (81.7 kg)  10/21/21 181 lb 12.8 oz (82.5 kg)   Hyperlipidemia: Diet/exercise approach.  Borderline ASCVD risk score in the past.  Intermittent statin dosing is an option but no meds at his December visit. No current meds - weight down, more exercise.  The 10-year ASCVD risk score (Arnett DK, et al., 2019) is: 9.4%   Values used to calculate the score:     Age: 75 years     Sex: Male     Is Non-Hispanic African American: Yes     Diabetic: No     Tobacco smoker: No     Systolic Blood Pressure: 128 mmHg     Is BP treated: Yes     HDL Cholesterol: 67.6 mg/dL     Total Cholesterol: 212 mg/dL  Lab Results  Component Value Date   CHOL 212 (H) 05/05/2022   HDL 67.60 05/05/2022   LDLCALC 130 (H) 05/05/2022   TRIG 70.0 05/05/2022   CHOLHDL 3 05/05/2022   Lab Results  Component Value Date   ALT 16 05/05/2022   AST 23 05/05/2022   ALKPHOS 43 05/05/2022   BILITOT 0.5 05/05/2022   HM:  Shingrix - declines until next visit.    History Patient Active Problem List   Diagnosis Date Noted   Essential hypertension 02/09/2015   Hyperlipidemia 02/09/2015   Health care maintenance  10/20/2011   Tinea pedis 10/20/2011   Past Medical History:  Diagnosis Date   Allergy    seasonal   Essential hypertension 02/09/2015   Hyperlipidemia 02/09/2015   Past Surgical History:  Procedure Laterality Date   NASAL SINUS SURGERY  2005   Allergies  Allergen Reactions   Latex    Prior to Admission medications   Medication Sig Start Date End Date Taking? Authorizing Provider  amLODipine (NORVASC) 10 MG tablet Take 1 tablet (10 mg total) by mouth daily. 05/05/22  Yes Shade Flood, MD  clobetasol ointment (TEMOVATE) 0.05 %  06/18/19  Yes [provider]  clotrimazole (LOTRIMIN) 1 % cream Apply 1 application topically 2 (two) times daily. Between toes. 09/04/19  Yes Shade Flood, MD  hydrochlorothiazide (HYDRODIURIL) 25 MG tablet Take 1 tablet (25 mg total) by mouth daily. 05/05/22  Yes Shade Flood, MD   Social History   Socioeconomic History   Marital status: Married    Spouse name: Not on file   Number of children: Not on file   Years of education: Not on file   Highest education level: Associate degree: occupational, Scientist, product/process development, or vocational program  Occupational History   Not on file  Tobacco Use   Smoking  status: Never   Smokeless tobacco: Never  Vaping Use   Vaping Use: Never used  Substance and Sexual Activity   Alcohol use: No    Alcohol/week: 0.0 standard drinks of alcohol   Drug use: No   Sexual activity: Yes  Other Topics Concern   Not on file  Social History Narrative   Not on file   Social Determinants of Health   Financial Resource Strain: Low Risk  (11/03/2022)   Overall Financial Resource Strain (CARDIA)    Difficulty of Paying Living Expenses: Not very hard  Food Insecurity: No Food Insecurity (11/03/2022)   Hunger Vital Sign    Worried About Running Out of Food in the Last Year: Never true    Ran Out of Food in the Last Year: Never true  Transportation Needs: No Transportation Needs (11/03/2022)   PRAPARE -  Administrator, Civil Service (Medical): No    Lack of Transportation (Non-Medical): No  Physical Activity: Sufficiently Active (11/03/2022)   Exercise Vital Sign    Days of Exercise per Week: 4 days    Minutes of Exercise per Session: 50 min  Stress: No Stress Concern Present (11/03/2022)   [employer] of Occupational Health - Occupational Stress Questionnaire    Feeling of Stress : Not at all  Social Connections: Moderately Integrated (11/03/2022)   Social Connection and Isolation Panel [NHANES]    Frequency of Communication with Friends and Family: More than three times a week    Frequency of Social Gatherings with Friends and Family: Once a week    Attends Religious Services: More than 4 times per year    Active Member of [employer] or Organizations: No    Attends Engineer, structural: Not on file    Marital Status: Married  Catering manager Violence: Not on file    Review of Systems  Constitutional:  Negative for fatigue and unexpected weight change.  Eyes:  Negative for visual disturbance.  Respiratory:  Negative for cough, chest tightness and shortness of breath.   Cardiovascular:  Negative for chest pain, palpitations and leg swelling.  Gastrointestinal:  Negative for abdominal pain and blood in stool.  Neurological:  Negative for dizziness, light-headedness and headaches.     Objective:   Vitals:   11/07/22 0835  BP: 128/70  Pulse: (!) 57  Temp: 98 F (36.7 C)  TempSrc: Temporal  SpO2: 97%  Weight: 176 lb 9.6 oz (80.1 kg)  Height: 5\' 8"  (1.727 m)     Physical Exam Vitals reviewed.  Constitutional:      Appearance: He is well-developed.  HENT:     Head: Normocephalic and atraumatic.  Neck:     Vascular: No carotid bruit or JVD.  Cardiovascular:     Rate and Rhythm: Normal rate and regular rhythm.     Heart sounds: Normal heart sounds. No murmur heard. Pulmonary:     Effort: Pulmonary effort is normal.     Breath sounds: Normal  breath sounds. No rales.  Musculoskeletal:     Right lower leg: No edema.     Left lower leg: No edema.  Skin:    General: Skin is warm and dry.  Neurological:     Mental Status: He is alert and oriented to person, place, and time.  Psychiatric:        Mood and Affect: Mood normal.        Assessment & Plan:  Dakota Bowman is a 52 y.o. male . Essential hypertension -  Plan: Comprehensive metabolic panel  -  Stable, tolerating current regimen. Medications have refills. Labs pending as above.   Prediabetes - Plan: Hemoglobin A1c Hyperlipidemia, unspecified hyperlipidemia type - Plan: Lipid panel  No current meds for prediabetes or hyperlipidemia but anticipate improvement with diet/exercise approach and increase exercise with weight loss as above.  Lab only visit and adjustment of plan accordingly.  No orders of the defined types were placed in this encounter.  Patient Instructions  You for coming in today.  Weight has improved, keep up the good work with exercise.  I expect your lab work including your prediabetes test and cholesterol to improve.  Depending on the cholesterol level we can discuss if medication is recommended but no medication changes for now.  Follow-up in 6 months for physical, but next week for a lab only visit.  Please let me know if there are questions.    Signed,   Meredith Staggers, MD [facility], [facility] 11/07/22 8:58 AM

## 2022-11-07 NOTE — Patient Instructions (Signed)
You for coming in today.  Weight has improved, keep up the good work with exercise.  I expect your lab work including your prediabetes test and cholesterol to improve.  Depending on the cholesterol level we can discuss if medication is recommended but no medication changes for now.  Follow-up in 6 months for physical, but next week for a lab only visit.  Please let me know if there are questions.

## 2022-11-14 ENCOUNTER — Other Ambulatory Visit (INDEPENDENT_AMBULATORY_CARE_PROVIDER_SITE_OTHER): Payer: 59

## 2022-11-14 DIAGNOSIS — I10 Essential (primary) hypertension: Secondary | ICD-10-CM

## 2022-11-14 DIAGNOSIS — E785 Hyperlipidemia, unspecified: Secondary | ICD-10-CM | POA: Diagnosis not present

## 2022-11-14 DIAGNOSIS — R7303 Prediabetes: Secondary | ICD-10-CM

## 2022-11-14 LAB — COMPREHENSIVE METABOLIC PANEL
ALT: 16 U/L (ref 0–53)
AST: 22 U/L (ref 0–37)
Albumin: 4 g/dL (ref 3.5–5.2)
Alkaline Phosphatase: 44 U/L (ref 39–117)
BUN: 21 mg/dL (ref 6–23)
CO2: 27 mEq/L (ref 19–32)
Calcium: 9.4 mg/dL (ref 8.4–10.5)
Chloride: 104 mEq/L (ref 96–112)
Creatinine, Ser: 1.24 mg/dL (ref 0.40–1.50)
GFR: 66.39 mL/min (ref >60.00)
Glucose, Bld: 92 mg/dL (ref 70–99)
Potassium: 3.9 mEq/L (ref 3.5–5.1)
Sodium: 137 mEq/L (ref 135–145)
Total Bilirubin: 0.4 mg/dL (ref 0.2–1.2)
Total Protein: 7.2 g/dL (ref 6.0–8.3)

## 2022-11-14 LAB — LIPID PANEL
Cholesterol: 204 mg/dL — ABNORMAL HIGH (ref 0–200)
HDL: 57.4 mg/dL (ref >39.00)
LDL Cholesterol: 135 mg/dL — ABNORMAL HIGH (ref 0–99)
NonHDL: 147.09
Total CHOL/HDL Ratio: 4
Triglycerides: 62 mg/dL (ref 0.0–149.0)
VLDL: 12.4 mg/dL (ref 0.0–40.0)

## 2022-11-14 LAB — HEMOGLOBIN A1C: Hgb A1c MFr Bld: 5.8 % (ref 4.6–6.5)

## 2023-05-10 ENCOUNTER — Encounter: Payer: Self-pay | Admitting: Family Medicine

## 2023-05-10 ENCOUNTER — Ambulatory Visit: Payer: 59 | Admitting: Family Medicine

## 2023-05-10 VITALS — BP 126/74 | HR 76 | Temp 97.9°F | Ht 67.75 in | Wt 179.2 lb

## 2023-05-10 DIAGNOSIS — Z0000 Encounter for general adult medical examination without abnormal findings: Secondary | ICD-10-CM | POA: Diagnosis not present

## 2023-05-10 DIAGNOSIS — Z125 Encounter for screening for malignant neoplasm of prostate: Secondary | ICD-10-CM

## 2023-05-10 DIAGNOSIS — I10 Essential (primary) hypertension: Secondary | ICD-10-CM

## 2023-05-10 DIAGNOSIS — Z23 Encounter for immunization: Secondary | ICD-10-CM

## 2023-05-10 DIAGNOSIS — R7303 Prediabetes: Secondary | ICD-10-CM | POA: Diagnosis not present

## 2023-05-10 DIAGNOSIS — Z130 Encounter for screening for diseases of the blood and blood-forming organs and certain disorders involving the immune mechanism: Secondary | ICD-10-CM | POA: Diagnosis not present

## 2023-05-10 DIAGNOSIS — E785 Hyperlipidemia, unspecified: Secondary | ICD-10-CM | POA: Diagnosis not present

## 2023-05-10 LAB — COMPREHENSIVE METABOLIC PANEL
ALT: 18 U/L (ref 0–53)
AST: 24 U/L (ref 0–37)
Albumin: 4.2 g/dL (ref 3.5–5.2)
Alkaline Phosphatase: 45 U/L (ref 39–117)
BUN: 16 mg/dL (ref 6–23)
CO2: 30 meq/L (ref 19–32)
Calcium: 9.3 mg/dL (ref 8.4–10.5)
Chloride: 103 meq/L (ref 96–112)
Creatinine, Ser: 1.23 mg/dL (ref 0.40–1.50)
GFR: 66.81 mL/min (ref >60.00)
Glucose, Bld: 80 mg/dL (ref 70–99)
Potassium: 4.1 meq/L (ref 3.5–5.1)
Sodium: 139 meq/L (ref 135–145)
Total Bilirubin: 0.7 mg/dL (ref 0.2–1.2)
Total Protein: 7.2 g/dL (ref 6.0–8.3)

## 2023-05-10 LAB — CBC WITH DIFFERENTIAL/PLATELET
Basophils Absolute: 0 10*3/uL (ref 0.0–0.1)
Basophils Relative: 0.4 % (ref 0.0–3.0)
Eosinophils Absolute: 0.1 10*3/uL (ref 0.0–0.7)
Eosinophils Relative: 3.5 % (ref 0.0–5.0)
HCT: 44.1 % (ref 39.0–52.0)
Hemoglobin: 14.5 g/dL (ref 13.0–17.0)
Lymphocytes Relative: 60.5 % — ABNORMAL HIGH (ref 12.0–46.0)
Lymphs Abs: 2 10*3/uL (ref 0.7–4.0)
MCHC: 32.9 g/dL (ref 30.0–36.0)
MCV: 82.3 fL (ref 78.0–100.0)
Monocytes Absolute: 0.2 10*3/uL (ref 0.1–1.0)
Monocytes Relative: 6.9 % (ref 3.0–12.0)
Neutro Abs: 0.9 10*3/uL — ABNORMAL LOW (ref 1.4–7.7)
Neutrophils Relative %: 28.7 % — ABNORMAL LOW (ref 43.0–77.0)
Platelets: 189 10*3/uL (ref 150.0–400.0)
RBC: 5.36 Mil/uL (ref 4.22–5.81)
RDW: 13.7 % (ref 11.5–15.5)
WBC: 3.3 10*3/uL — ABNORMAL LOW (ref 4.0–10.5)

## 2023-05-10 LAB — LIPID PANEL
Cholesterol: 248 mg/dL — ABNORMAL HIGH (ref 0–200)
HDL: 66.9 mg/dL (ref >39.00)
LDL Cholesterol: 169 mg/dL — ABNORMAL HIGH (ref 0–99)
NonHDL: 181.08
Total CHOL/HDL Ratio: 4
Triglycerides: 60 mg/dL (ref 0.0–149.0)
VLDL: 12 mg/dL (ref 0.0–40.0)

## 2023-05-10 LAB — HEMOGLOBIN A1C: Hgb A1c MFr Bld: 6.2 % (ref 4.6–6.5)

## 2023-05-10 LAB — PSA: PSA: 0.35 ng/mL (ref 0.10–4.00)

## 2023-05-10 MED ORDER — AMLODIPINE BESYLATE 10 MG PO TABS: 10.0000 mg | ORAL_TABLET | Freq: Every day | ORAL | 3 refills | Status: DC

## 2023-05-10 MED ORDER — HYDROCHLOROTHIAZIDE 25 MG PO TABS: 25.0000 mg | ORAL_TABLET | Freq: Every day | ORAL | 3 refills | Status: DC

## 2023-05-10 NOTE — Progress Notes (Signed)
Subjective:  Patient ID: Dakota Bowman, male    DOB: [birth year]  Age: 52 y.o. MRN: [id number]  CC:  Chief Complaint  Patient presents with   Annual Exam    Pt is doing well, pt is fasting     HPI Dakota Bowman presents for Annual Exam, no health changes. Busy with work.   Hypertension: Amlodipine 10 mg daily, hydrochlorothiazide 25 mg daily.  No new side effects. Home readings: 130/80, 118/70's BP Readings from Last 3 Encounters:  05/10/23 126/74  11/07/22 128/70  05/05/22 138/78   Lab Results  Component Value Date   CREATININE 1.24 11/14/2022   Prediabetes: Diet/exercise approach.  Weight slightly increased from his last visit.  Had lost weight at that time. Still exercising - gym - 4-5d per week.  Fast food: rare. Cooks at home usually.  Sodas/sugar beverages:none Lab Results  Component Value Date   HGBA1C 5.8 11/14/2022   Wt Readings from Last 3 Encounters:  05/10/23 179 lb 3.2 oz (81.3 kg)  11/07/22 176 lb 9.6 oz (80.1 kg)  05/05/22 180 lb 3.2 oz (81.7 kg)   Hyperlipidemia: Also diet/exercise approach with borderline ASCVD risk score previously.  We have discussed intermittent statin dosing as option but weight had improved at last visit.  No current statin. The 10-year ASCVD risk score (Arnett DK, et al., 2019) is: 9.4%   Values used to calculate the score:     Age: 73 years     Sex: Male     Is Non-Hispanic African American: Yes     Diabetic: No     Tobacco smoker: No     Systolic Blood Pressure: 126 mmHg     Is BP treated: Yes     HDL Cholesterol: 57.4 mg/dL     Total Cholesterol: 204 mg/dL  Lab Results  Component Value Date   CHOL 204 (H) 11/14/2022   HDL 57.40 11/14/2022   LDLCALC 135 (H) 11/14/2022   TRIG 62.0 11/14/2022   CHOLHDL 4 11/14/2022   Lab Results  Component Value Date   ALT 16 11/14/2022   AST 22 11/14/2022   ALKPHOS 44 11/14/2022   BILITOT 0.4 11/14/2022        05/10/2023    8:33 AM 11/07/2022    8:32 AM 05/05/2022     8:41 AM 10/21/2021    9:44 AM 04/21/2021    8:12 AM  Depression screen PHQ 2/9  Decreased Interest 0 0 0 0 0  Down, Depressed, Hopeless 0 0 0 0 0  PHQ - 2 Score 0 0 0 0 0  Altered sleeping 0 0 0 0   Tired, decreased energy 0 0 0 0   Change in appetite 0 0 0 0   Feeling bad or failure about yourself  0 0 0 0   Trouble concentrating 0 0 0 0   Moving slowly or fidgety/restless 0 0 0 0   Suicidal thoughts 0 0 0 0   PHQ-9 Score 0 0 0 0   Difficult doing work/chores    Not difficult at all     Health Maintenance  Topic Date Due   Zoster Vaccines- Shingrix (1 of 2) Never done   INFLUENZA VACCINE  12/22/2022   COVID-19 Vaccine (1 - 2024-25 season) Never done   Colonoscopy  10/28/2029   Hepatitis C Screening  Completed   HIV Screening  Completed   HPV VACCINES  Aged Out   DTaP/Tdap/Td  Discontinued  Colonoscopy 10/2019, Dr. Marina Goodell, repeat 10 years.  Prostate: does not have family history of prostate cancer The natural history of prostate cancer and ongoing controversy regarding screening and potential treatment outcomes of prostate cancer has been discussed with the patient. The meaning of a false positive PSA and a false negative PSA has been discussed. He indicates understanding of the limitations of this screening test and wishes to proceed with screening PSA testing. Lab Results  Component Value Date   PSA1 0.3 02/14/2017   PSA 0.30 04/21/2021   PSA 0.31 02/09/2015    Immunization History  Administered Date(s) Administered   Influenza,inj,Quad PF,6+ Mos 02/09/2016, 02/14/2017, 03/03/2018, 03/06/2019, 03/11/2020, 04/21/2021, 05/05/2022   Influenza-Unspecified 03/24/2015   Tdap 10/20/2011  Flu vaccine today.  Covid booster - declines.  Tdap - today.  Shingles vaccine - defers today - will take info.    No results found. Optho - in past year.   Dental: every 6 months or more. Recent visit.   Alcohol: none  Tobacco: none  Exercise:gym 4-5 days/week.  1 hour each on  average.    History Patient Active Problem List   Diagnosis Date Noted   Essential hypertension 02/09/2015   Hyperlipidemia 02/09/2015   Health care maintenance 10/20/2011   Tinea pedis 10/20/2011   Past Medical History:  Diagnosis Date   Allergy    seasonal   Essential hypertension 02/09/2015   Hyperlipidemia 02/09/2015   Past Surgical History:  Procedure Laterality Date   NASAL SINUS SURGERY  2005   Allergies  Allergen Reactions   Latex    Prior to Admission medications   Medication Sig Start Date End Date Taking? Authorizing Provider  amLODipine (NORVASC) 10 MG tablet Take 1 tablet (10 mg total) by mouth daily. 05/05/22  Yes Shade Flood, MD  hydrochlorothiazide (HYDRODIURIL) 25 MG tablet Take 1 tablet (25 mg total) by mouth daily. 05/05/22  Yes Shade Flood, MD  clobetasol ointment (TEMOVATE) 0.05 %  06/18/19   [provider]  clotrimazole (LOTRIMIN) 1 % cream Apply 1 application topically 2 (two) times daily. Between toes. Patient not taking: Reported on 05/10/2023 09/04/19   Shade Flood, MD   Social History   Socioeconomic History   Marital status: Married    Spouse name: Not on file   Number of children: Not on file   Years of education: Not on file   Highest education level: Associate degree: occupational, Scientist, product/process development, or vocational program  Occupational History   Not on file  Tobacco Use   Smoking status: Never   Smokeless tobacco: Never  Vaping Use   Vaping status: Never Used  Substance and Sexual Activity   Alcohol use: No    Alcohol/week: 0.0 standard drinks of alcohol   Drug use: No   Sexual activity: Yes  Other Topics Concern   Not on file  Social History Narrative   Not on file   Social Drivers of Health   Financial Resource Strain: Low Risk  (11/03/2022)   Overall Financial Resource Strain (CARDIA)    Difficulty of Paying Living Expenses: Not very hard  Food Insecurity: No Food Insecurity (11/03/2022)   Hunger Vital  Sign    Worried About Running Out of Food in the Last Year: Never true    Ran Out of Food in the Last Year: Never true  Transportation Needs: No Transportation Needs (11/03/2022)   PRAPARE - Administrator, Civil Service (Medical): No    Lack of Transportation (Non-Medical): No  Physical Activity: Sufficiently Active (11/03/2022)  Exercise Vital Sign    Days of Exercise per Week: 4 days    Minutes of Exercise per Session: 50 min  Stress: No Stress Concern Present (11/03/2022)   [employer] of Occupational Health - Occupational Stress Questionnaire    Feeling of Stress : Not at all  Social Connections: Moderately Integrated (11/03/2022)   Social Connection and Isolation Panel [NHANES]    Frequency of Communication with Friends and Family: More than three times a week    Frequency of Social Gatherings with Friends and Family: Once a week    Attends Religious Services: More than 4 times per year    Active Member of [employer] or Organizations: No    Attends Engineer, structural: Not on file    Marital Status: Married  Catering manager Violence: Not on file    Review of Systems 13 point review of systems per patient health survey noted.  Negative other than as indicated above or in HPI.    Objective:   Vitals:   05/10/23 0824  BP: 126/74  Pulse: 76  Temp: 97.9 F (36.6 C)  TempSrc: Temporal  SpO2: 100%  Weight: 179 lb 3.2 oz (81.3 kg)  Height: 5' 7.75" (1.721 m)     Physical Exam Vitals reviewed.  Constitutional:      Appearance: He is well-developed.  HENT:     Head: Normocephalic and atraumatic.     Right Ear: External ear normal.     Left Ear: External ear normal.  Eyes:     Conjunctiva/sclera: Conjunctivae normal.     Pupils: Pupils are equal, round, and reactive to light.  Neck:     Thyroid: No thyromegaly.  Cardiovascular:     Rate and Rhythm: Normal rate and regular rhythm.     Heart sounds: Normal heart sounds.  Pulmonary:     Effort:  Pulmonary effort is normal. No respiratory distress.     Breath sounds: Normal breath sounds. No wheezing.  Abdominal:     General: There is no distension.     Palpations: Abdomen is soft.     Tenderness: There is no abdominal tenderness.  Musculoskeletal:        General: No tenderness. Normal range of motion.     Cervical back: Normal range of motion and neck supple.  Lymphadenopathy:     Cervical: No cervical adenopathy.  Skin:    General: Skin is warm and dry.  Neurological:     Mental Status: He is alert and oriented to person, place, and time.     Deep Tendon Reflexes: Reflexes are normal and symmetric.  Psychiatric:        Behavior: Behavior normal.        Assessment & Plan:  Dakota Bowman is a 52 y.o. male . Annual physical exam - Plan: CBC with Differential/Platelet, Comprehensive metabolic panel, Lipid panel, Hemoglobin A1c  - -anticipatory guidance as below in AVS, screening labs above. Health maintenance items as above in HPI discussed/recommended as applicable.   Essential hypertension - Plan: CBC with Differential/Platelet, amLODipine (NORVASC) 10 MG tablet, hydrochlorothiazide (HYDRODIURIL) 25 MG tablet  -  Stable, tolerating current regimen. Medications refilled. Labs pending as above.   Hyperlipidemia, unspecified hyperlipidemia type - Plan: Comprehensive metabolic panel, Lipid panel  -Continue exercise, watching diet, check labs and adjust plan accordingly, borderline ASCVD score previously.  Prediabetes - Plan: Comprehensive metabolic panel, Hemoglobin A1c  -Exercise, diet approach, check labs.  Adjust plan accordingly  Screening for prostate cancer  Check PSA  Screening for deficiency anemia - Plan: CBC with Differential/Platelet  Need for influenza vaccination - Plan: Flu vaccine trivalent PF, 6mos and older(Flulaval,Afluria,Fluarix,Fluzone)  Need for Tdap vaccination - Plan: Tdap vaccine greater than or equal to 7yo IM   Meds ordered this  encounter  Medications   amLODipine (NORVASC) 10 MG tablet    Sig: Take 1 tablet (10 mg total) by mouth daily.    Dispense:  90 tablet    Refill:  3   hydrochlorothiazide (HYDRODIURIL) 25 MG tablet    Sig: Take 1 tablet (25 mg total) by mouth daily.    Dispense:  90 tablet    Refill:  3   Patient Instructions  Thank you for coming in today.  No change in blood pressure medicine for now.  I will check your blood sugar and cholesterol levels as well as other blood work and let you know if there are concerns or any changes needed.  Keep up the good work with watching diet and exercise.  Flu vaccine and tetanus vaccines given today.  Shingles vaccine can be given next visit.  Let me know if there are questions and take care.   Preventive Care 76-21 Years Old, Male Preventive care refers to lifestyle choices and visits with your health care provider that can promote health and wellness. Preventive care visits are also called wellness exams. What can I expect for my preventive care visit? Counseling During your preventive care visit, your health care provider may ask about your: Medical history, including: Past medical problems. Family medical history. Current health, including: Emotional well-being. Home life and relationship well-being. Sexual activity. Lifestyle, including: Alcohol, nicotine or tobacco, and drug use. Access to firearms. Diet, exercise, and sleep habits. Safety issues such as seatbelt and bike helmet use. Sunscreen use. Work and work Astronomer. Physical exam Your health care provider will check your: Height and weight. These may be used to calculate your BMI (body mass index). BMI is a measurement that tells if you are at a healthy weight. Waist circumference. This measures the distance around your waistline. This measurement also tells if you are at a healthy weight and may help predict your risk of certain diseases, such as type 2 diabetes and high blood  pressure. Heart rate and blood pressure. Body temperature. Skin for abnormal spots. What immunizations do I need?  Vaccines are usually given at various ages, according to a schedule. Your health care provider will recommend vaccines for you based on your age, medical history, and lifestyle or other factors, such as travel or where you work. What tests do I need? Screening Your health care provider may recommend screening tests for certain conditions. This may include: Lipid and cholesterol levels. Diabetes screening. This is done by checking your blood sugar (glucose) after you have not eaten for a while (fasting). Hepatitis B test. Hepatitis C test. HIV (human immunodeficiency virus) test. STI (sexually transmitted infection) testing, if you are at risk. Lung cancer screening. Prostate cancer screening. Colorectal cancer screening. Talk with your health care provider about your test results, treatment options, and if necessary, the need for more tests. Follow these instructions at home: Eating and drinking  Eat a diet that includes fresh fruits and vegetables, whole grains, lean protein, and low-fat dairy products. Take vitamin and mineral supplements as recommended by your health care provider. Do not drink alcohol if your health care provider tells you not to drink. If you drink alcohol: Limit how much you have to 0-2 drinks a  day. Know how much alcohol is in your drink. In the [place] one drink equals one 12 oz bottle of beer (355 mL), one 5 oz glass of wine (148 mL), or one 1 oz glass of hard liquor (44 mL). Lifestyle Brush your teeth every morning and night with fluoride toothpaste. Floss one time each day. Exercise for at least 30 minutes 5 or more days each week. Do not use any products that contain nicotine or tobacco. These products include cigarettes, chewing tobacco, and vaping devices, such as e-cigarettes. If you need help quitting, ask your health care provider. Do not  use drugs. If you are sexually active, practice safe sex. Use a condom or other form of protection to prevent STIs. Take aspirin only as told by your health care provider. Make sure that you understand how much to take and what form to take. Work with your health care provider to find out whether it is safe and beneficial for you to take aspirin daily. Find healthy ways to manage stress, such as: Meditation, yoga, or listening to music. Journaling. Talking to a trusted person. Spending time with friends and family. Minimize exposure to UV radiation to reduce your risk of skin cancer. Safety Always wear your seat belt while driving or riding in a vehicle. Do not drive: If you have been drinking alcohol. Do not ride with someone who has been drinking. When you are tired or distracted. While texting. If you have been using any mind-altering substances or drugs. Wear a helmet and other protective equipment during sports activities. If you have firearms in your house, make sure you follow all gun safety procedures. What's next? Go to your health care provider once a year for an annual wellness visit. Ask your health care provider how often you should have your eyes and teeth checked. Stay up to date on all vaccines. This information is not intended to replace advice given to you by your health care provider. Make sure you discuss any questions you have with your health care provider. Document Revised: 11/04/2020 Document Reviewed: 11/04/2020 Elsevier Patient Education  2024 Elsevier Inc.     Signed,   Meredith Staggers, MD [facility], [facility] 05/10/23 8:48 AM

## 2023-05-10 NOTE — Patient Instructions (Signed)
Thank you for coming in today.  No change in blood pressure medicine for now.  I will check your blood sugar and cholesterol levels as well as other blood work and let you know if there are concerns or any changes needed.  Keep up the good work with watching diet and exercise.  Flu vaccine and tetanus vaccines given today.  Shingles vaccine can be given next visit.  Let me know if there are questions and take care.   Preventive Care 16-53 Years Old, Male Preventive care refers to lifestyle choices and visits with your health care provider that can promote health and wellness. Preventive care visits are also called wellness exams. What can I expect for my preventive care visit? Counseling During your preventive care visit, your health care provider may ask about your: Medical history, including: Past medical problems. Family medical history. Current health, including: Emotional well-being. Home life and relationship well-being. Sexual activity. Lifestyle, including: Alcohol, nicotine or tobacco, and drug use. Access to firearms. Diet, exercise, and sleep habits. Safety issues such as seatbelt and bike helmet use. Sunscreen use. Work and work Astronomer. Physical exam Your health care provider will check your: Height and weight. These may be used to calculate your BMI (body mass index). BMI is a measurement that tells if you are at a healthy weight. Waist circumference. This measures the distance around your waistline. This measurement also tells if you are at a healthy weight and may help predict your risk of certain diseases, such as type 2 diabetes and high blood pressure. Heart rate and blood pressure. Body temperature. Skin for abnormal spots. What immunizations do I need?  Vaccines are usually given at various ages, according to a schedule. Your health care provider will recommend vaccines for you based on your age, medical history, and lifestyle or other factors, such as  travel or where you work. What tests do I need? Screening Your health care provider may recommend screening tests for certain conditions. This may include: Lipid and cholesterol levels. Diabetes screening. This is done by checking your blood sugar (glucose) after you have not eaten for a while (fasting). Hepatitis B test. Hepatitis C test. HIV (human immunodeficiency virus) test. STI (sexually transmitted infection) testing, if you are at risk. Lung cancer screening. Prostate cancer screening. Colorectal cancer screening. Talk with your health care provider about your test results, treatment options, and if necessary, the need for more tests. Follow these instructions at home: Eating and drinking  Eat a diet that includes fresh fruits and vegetables, whole grains, lean protein, and low-fat dairy products. Take vitamin and mineral supplements as recommended by your health care provider. Do not drink alcohol if your health care provider tells you not to drink. If you drink alcohol: Limit how much you have to 0-2 drinks a day. Know how much alcohol is in your drink. In the [place] one drink equals one 12 oz bottle of beer (355 mL), one 5 oz glass of wine (148 mL), or one 1 oz glass of hard liquor (44 mL). Lifestyle Brush your teeth every morning and night with fluoride toothpaste. Floss one time each day. Exercise for at least 30 minutes 5 or more days each week. Do not use any products that contain nicotine or tobacco. These products include cigarettes, chewing tobacco, and vaping devices, such as e-cigarettes. If you need help quitting, ask your health care provider. Do not use drugs. If you are sexually active, practice safe sex. Use a condom or other form  of protection to prevent STIs. Take aspirin only as told by your health care provider. Make sure that you understand how much to take and what form to take. Work with your health care provider to find out whether it is safe and  beneficial for you to take aspirin daily. Find healthy ways to manage stress, such as: Meditation, yoga, or listening to music. Journaling. Talking to a trusted person. Spending time with friends and family. Minimize exposure to UV radiation to reduce your risk of skin cancer. Safety Always wear your seat belt while driving or riding in a vehicle. Do not drive: If you have been drinking alcohol. Do not ride with someone who has been drinking. When you are tired or distracted. While texting. If you have been using any mind-altering substances or drugs. Wear a helmet and other protective equipment during sports activities. If you have firearms in your house, make sure you follow all gun safety procedures. What's next? Go to your health care provider once a year for an annual wellness visit. Ask your health care provider how often you should have your eyes and teeth checked. Stay up to date on all vaccines. This information is not intended to replace advice given to you by your health care provider. Make sure you discuss any questions you have with your health care provider. Document Revised: 11/04/2020 Document Reviewed: 11/04/2020 Elsevier Patient Education  2024 [employer].

## 2023-10-30 ENCOUNTER — Encounter: Payer: Self-pay | Admitting: Family Medicine

## 2023-10-30 ENCOUNTER — Ambulatory Visit (INDEPENDENT_AMBULATORY_CARE_PROVIDER_SITE_OTHER): Admitting: Family Medicine

## 2023-10-30 VITALS — BP 120/84 | HR 63 | Temp 98.5°F | Wt 177.6 lb

## 2023-10-30 DIAGNOSIS — R7303 Prediabetes: Secondary | ICD-10-CM

## 2023-10-30 DIAGNOSIS — I10 Essential (primary) hypertension: Secondary | ICD-10-CM | POA: Diagnosis not present

## 2023-10-30 DIAGNOSIS — R7989 Other specified abnormal findings of blood chemistry: Secondary | ICD-10-CM

## 2023-10-30 DIAGNOSIS — E785 Hyperlipidemia, unspecified: Secondary | ICD-10-CM

## 2023-10-30 LAB — CBC WITH DIFFERENTIAL/PLATELET
Basophils Absolute: 0 10*3/uL (ref 0.0–0.1)
Basophils Relative: 0.6 % (ref 0.0–3.0)
Eosinophils Absolute: 0.3 10*3/uL (ref 0.0–0.7)
Eosinophils Relative: 8.3 % — ABNORMAL HIGH (ref 0.0–5.0)
HCT: 41.4 % (ref 39.0–52.0)
Hemoglobin: 13.5 g/dL (ref 13.0–17.0)
Lymphocytes Relative: 53.8 % — ABNORMAL HIGH (ref 12.0–46.0)
Lymphs Abs: 1.7 10*3/uL (ref 0.7–4.0)
MCHC: 32.5 g/dL (ref 30.0–36.0)
MCV: 80.1 fl (ref 78.0–100.0)
Monocytes Absolute: 0.3 10*3/uL (ref 0.1–1.0)
Monocytes Relative: 10.4 % (ref 3.0–12.0)
Neutro Abs: 0.9 10*3/uL — ABNORMAL LOW (ref 1.4–7.7)
Neutrophils Relative %: 26.9 % — ABNORMAL LOW (ref 43.0–77.0)
Platelets: 167 10*3/uL (ref 150.0–400.0)
RBC: 5.17 Mil/uL (ref 4.22–5.81)
RDW: 13.8 % (ref 11.5–15.5)
WBC: 3.2 10*3/uL — ABNORMAL LOW (ref 4.0–10.5)

## 2023-10-30 LAB — LIPID PANEL
Cholesterol: 213 mg/dL — ABNORMAL HIGH (ref 0–200)
HDL: 62 mg/dL (ref >39.00)
LDL Cholesterol: 136 mg/dL — ABNORMAL HIGH (ref 0–99)
NonHDL: 150.84
Total CHOL/HDL Ratio: 3
Triglycerides: 74 mg/dL (ref 0.0–149.0)
VLDL: 14.8 mg/dL (ref 0.0–40.0)

## 2023-10-30 LAB — COMPREHENSIVE METABOLIC PANEL WITH GFR
ALT: 16 U/L (ref 0–53)
AST: 21 U/L (ref 0–37)
Albumin: 4.3 g/dL (ref 3.5–5.2)
Alkaline Phosphatase: 38 U/L — ABNORMAL LOW (ref 39–117)
BUN: 15 mg/dL (ref 6–23)
CO2: 28 meq/L (ref 19–32)
Calcium: 9.2 mg/dL (ref 8.4–10.5)
Chloride: 105 meq/L (ref 96–112)
Creatinine, Ser: 1.13 mg/dL (ref 0.40–1.50)
GFR: 73.72 mL/min (ref >60.00)
Glucose, Bld: 90 mg/dL (ref 70–99)
Potassium: 3.9 meq/L (ref 3.5–5.1)
Sodium: 139 meq/L (ref 135–145)
Total Bilirubin: 0.5 mg/dL (ref 0.2–1.2)
Total Protein: 7.3 g/dL (ref 6.0–8.3)

## 2023-10-30 LAB — HEMOGLOBIN A1C: Hgb A1c MFr Bld: 6 % (ref 4.6–6.5)

## 2023-10-30 NOTE — Progress Notes (Signed)
 Subjective:  Patient ID: Dakota Bowman, male    DOB: December 12, 1970  Age: 53 y.o. MRN: [id number]  CC:  Chief Complaint  Patient presents with   Medical Management of Chronic Issues    HPI Dakota Bowman presents for   Hypertension: Amlodipine  10 mg daily, hydrochlorothiazide  25 mg daily without any side effects. Home readings: 120/80 range usually.  BP Readings from Last 3 Encounters:  10/30/23 120/84  05/10/23 126/74  11/07/22 128/70   Lab Results  Component Value Date   CREATININE 1.23 05/10/2023   Prediabetes: No current meds, diet/exercise approach.  Weight down 2 pounds, exercising 4 to 5 days/week and usually cooking at home.  Avoidance of sugar containing beverages.  Lab Results  Component Value Date   HGBA1C 6.2 05/10/2023   Wt Readings from Last 3 Encounters:  10/30/23 177 lb 9.6 oz (80.6 kg)  05/10/23 179 lb 3.2 oz (81.3 kg)  11/07/22 176 lb 9.6 oz (80.1 kg)   Hyperlipidemia: Elevated 10-year heart disease risk score discussed previously (9.5%) and option of statin. We also discussed option of CCS testing to help decide depending on labs. Would like to check labs first.  Lab Results  Component Value Date   CHOL 248 (H) 05/10/2023   HDL 66.90 05/10/2023   LDLCALC 169 (H) 05/10/2023   TRIG 60.0 05/10/2023   CHOLHDL 4 05/10/2023   Lab Results  Component Value Date   ALT 18 05/10/2023   AST 24 05/10/2023   ALKPHOS 45 05/10/2023   BILITOT 0.7 05/10/2023   Neutropenia: Persistent borderline low WBC with elevated lymphocytes previously.  Option of hematology referral.did not hear back form him.  No fevers or recent illness. No unexplained weight loss or night sweats.    Lab Results  Component Value Date   WBC 3.3 (L) 05/10/2023   HGB 14.5 05/10/2023   HCT 44.1 05/10/2023   MCV 82.3 05/10/2023   PLT 189.0 05/10/2023    History Patient Active Problem List   Diagnosis Date Noted   Essential hypertension 02/09/2015   Hyperlipidemia 02/09/2015    Health care maintenance 10/20/2011   Tinea pedis 10/20/2011   Past Medical History:  Diagnosis Date   Allergy    seasonal   Essential hypertension 02/09/2015   Hyperlipidemia 02/09/2015   Past Surgical History:  Procedure Laterality Date   NASAL SINUS SURGERY  2005   Allergies  Allergen Reactions   Latex    Prior to Admission medications   Medication Sig Start Date End Date Taking? Authorizing Provider  amLODipine  (NORVASC ) 10 MG tablet Take 1 tablet (10 mg total) by mouth daily. 05/10/23  Yes Benjiman Bras, MD  clobetasol ointment (TEMOVATE) 0.05 %  06/18/19  Yes [provider]  clotrimazole  (LOTRIMIN ) 1 % cream Apply 1 application topically 2 (two) times daily. Between toes. 09/04/19  Yes Benjiman Bras, MD  hydrochlorothiazide  (HYDRODIURIL ) 25 MG tablet Take 1 tablet (25 mg total) by mouth daily. 05/10/23  Yes Benjiman Bras, MD   Social History   Socioeconomic History   Marital status: Married    Spouse name: Not on file   Number of children: Not on file   Years of education: Not on file   Highest education level: Associate degree: occupational, Scientist, product/process development, or vocational program  Occupational History   Not on file  Tobacco Use   Smoking status: Never   Smokeless tobacco: Never  Vaping Use   Vaping status: Never Used  Substance and Sexual Activity   Alcohol use:  No    Alcohol/week: 0.0 standard drinks of alcohol   Drug use: No   Sexual activity: Yes  Other Topics Concern   Not on file  Social History Narrative   Not on file   Social Drivers of Health   Financial Resource Strain: Low Risk  (10/28/2023)   Overall Financial Resource Strain (CARDIA)    Difficulty of Paying Living Expenses: Not very hard  Food Insecurity: No Food Insecurity (10/28/2023)   Hunger Vital Sign    Worried About Running Out of Food in the Last Year: Never true    Ran Out of Food in the Last Year: Never true  Transportation Needs: No Transportation Needs (10/28/2023)    PRAPARE - Administrator, Civil Service (Medical): No    Lack of Transportation (Non-Medical): No  Physical Activity: Sufficiently Active (10/28/2023)   Exercise Vital Sign    Days of Exercise per Week: 5 days    Minutes of Exercise per Session: 60 min  Stress: No Stress Concern Present (10/28/2023)   [employer] of Occupational Health - Occupational Stress Questionnaire    Feeling of Stress : Not at all  Social Connections: Moderately Integrated (10/28/2023)   Social Connection and Isolation Panel [NHANES]    Frequency of Communication with Friends and Family: More than three times a week    Frequency of Social Gatherings with Friends and Family: Three times a week    Attends Religious Services: More than 4 times per year    Active Member of Clubs or Organizations: No    Attends Engineer, structural: Not on file    Marital Status: Married  Catering manager Violence: Not on file    Review of Systems  Constitutional:  Negative for fatigue and unexpected weight change.  Eyes:  Negative for visual disturbance.  Respiratory:  Negative for cough, chest tightness and shortness of breath.   Cardiovascular:  Negative for chest pain, palpitations and leg swelling.  Gastrointestinal:  Negative for abdominal pain and blood in stool.  Neurological:  Negative for dizziness, light-headedness and headaches.     Objective:   Vitals:   10/30/23 0950  BP: 120/84  Pulse: 63  Temp: 98.5 F (36.9 C)  TempSrc: Oral  SpO2: 100%  Weight: 177 lb 9.6 oz (80.6 kg)     Physical Exam Vitals reviewed.  Constitutional:      Appearance: He is well-developed.  HENT:     Head: Normocephalic and atraumatic.  Neck:     Vascular: No carotid bruit or JVD.  Cardiovascular:     Rate and Rhythm: Normal rate and regular rhythm.     Heart sounds: Normal heart sounds. No murmur heard. Pulmonary:     Effort: Pulmonary effort is normal.     Breath sounds: Normal breath sounds. No  rales.  Musculoskeletal:     Right lower leg: No edema.     Left lower leg: No edema.  Skin:    General: Skin is warm and dry.  Neurological:     Mental Status: He is alert and oriented to person, place, and time.  Psychiatric:        Mood and Affect: Mood normal.        Assessment & Plan:  Dakota Bowman is a 53 y.o. male . Essential hypertension - Plan: Comprehensive metabolic panel with GFR  - Tolerating current med regimen, no changes, check labs and adjust plan accordingly  Hyperlipidemia, unspecified hyperlipidemia type - Plan: Comprehensive metabolic panel with  GFR, Lipid panel  - Check labs, borderline ASCVD risk score previously.  Consider coronary calcium scoring testing, briefly discussed today.  Consider low-dose statin.  Check labs and adjust plan accordingly  Prediabetes - Plan: Hemoglobin A1c  - Weight has improved by few pounds.  Continue healthy eating, exercise, check labs and adjust plan accordingly  Abnormal CBC - Plan: CBC with Differential/Platelet  - Elevated lymphocytes, low WBC previously.  Repeat labs and consider hematology eval.  Asymptomatic  No orders of the defined types were placed in this encounter.  Patient Instructions  Thank you for coming in today.  I will look at your cholesterol levels and then we can discuss options either medication or the coronary calcium scoring test which is a CT scan that looks for calcium and plaque around the arteries.  That is usually around $100 out-of-pocket but we can discuss the need for that test once I see the results.  No new medications for now.  I will also recheck your blood counts as some of your blood tests have been slightly low in the past and then can decide if meeting with a blood specialist or hematologist may be necessary.  Follow-up in 6 months for a physical but let me know if you have any questions in the meantime.  Take care!    Signed,   Caro Christmas, MD [facility], [facility] 10/30/23 10:20 AM

## 2023-10-30 NOTE — Patient Instructions (Addendum)
 Thank you for coming in today.  I will look at your cholesterol levels and then we can discuss options either medication or the coronary calcium scoring test which is a CT scan that looks for calcium and plaque around the arteries.  That is usually around $100 out-of-pocket but we can discuss the need for that test once I see the results.  No new medications for now.  I will also recheck your blood counts as some of your blood tests have been slightly low in the past and then can decide if meeting with a blood specialist or hematologist may be necessary.  Follow-up in 6 months for a physical but let me know if you have any questions in the meantime.  Take care!

## 2023-10-31 ENCOUNTER — Ambulatory Visit: Payer: Self-pay | Admitting: Family Medicine

## 2023-10-31 DIAGNOSIS — E785 Hyperlipidemia, unspecified: Secondary | ICD-10-CM

## 2023-11-01 NOTE — Telephone Encounter (Signed)
 Pt is okay with calcium score test

## 2023-11-02 NOTE — Addendum Note (Signed)
 Addended by: Nole Robey R on: 11/02/2023 07:55 AM   Modules accepted: Orders

## 2023-11-08 ENCOUNTER — Ambulatory Visit: Payer: 59 | Admitting: Family Medicine

## 2023-11-27 ENCOUNTER — Ambulatory Visit (HOSPITAL_BASED_OUTPATIENT_CLINIC_OR_DEPARTMENT_OTHER)
Admission: RE | Admit: 2023-11-27 | Discharge: 2023-11-27 | Disposition: A | Discharge status: Home / Self Care | Payer: Self-pay | Source: Ambulatory Visit | Attending: Family Medicine | Admitting: Family Medicine

## 2023-11-27 DIAGNOSIS — E785 Hyperlipidemia, unspecified: Secondary | ICD-10-CM | POA: Insufficient documentation

## 2023-11-28 ENCOUNTER — Ambulatory Visit: Payer: Self-pay | Admitting: Family Medicine

## 2024-05-22 ENCOUNTER — Encounter: Payer: Self-pay | Admitting: Family Medicine

## 2024-05-22 ENCOUNTER — Encounter: Admitting: Family Medicine

## 2024-05-22 VITALS — BP 132/82 | HR 60 | Temp 97.8°F | Resp 16 | Ht 67.75 in | Wt 185.2 lb

## 2024-05-22 DIAGNOSIS — Z130 Encounter for screening for diseases of the blood and blood-forming organs and certain disorders involving the immune mechanism: Secondary | ICD-10-CM

## 2024-05-22 DIAGNOSIS — Z0000 Encounter for general adult medical examination without abnormal findings: Secondary | ICD-10-CM | POA: Diagnosis not present

## 2024-05-22 DIAGNOSIS — I10 Essential (primary) hypertension: Secondary | ICD-10-CM | POA: Diagnosis not present

## 2024-05-22 DIAGNOSIS — E785 Hyperlipidemia, unspecified: Secondary | ICD-10-CM

## 2024-05-22 DIAGNOSIS — R7303 Prediabetes: Secondary | ICD-10-CM | POA: Diagnosis not present

## 2024-05-22 DIAGNOSIS — L249 Irritant contact dermatitis, unspecified cause: Secondary | ICD-10-CM | POA: Insufficient documentation

## 2024-05-22 DIAGNOSIS — L309 Dermatitis, unspecified: Secondary | ICD-10-CM | POA: Insufficient documentation

## 2024-05-22 DIAGNOSIS — Z23 Encounter for immunization: Secondary | ICD-10-CM

## 2024-05-22 LAB — COMPREHENSIVE METABOLIC PANEL WITH GFR
ALT: 19 U/L (ref 3–53)
AST: 21 U/L (ref 5–37)
Albumin: 4.4 g/dL (ref 3.5–5.2)
Alkaline Phosphatase: 42 U/L (ref 39–117)
BUN: 13 mg/dL (ref 6–23)
CO2: 30 meq/L (ref 19–32)
Calcium: 9.2 mg/dL (ref 8.4–10.5)
Chloride: 103 meq/L (ref 96–112)
Creatinine, Ser: 1.15 mg/dL (ref 0.40–1.50)
GFR: 71.91 mL/min
Glucose, Bld: 79 mg/dL (ref 70–99)
Potassium: 4.1 meq/L (ref 3.5–5.1)
Sodium: 139 meq/L (ref 135–145)
Total Bilirubin: 0.5 mg/dL (ref 0.2–1.2)
Total Protein: 7.4 g/dL (ref 6.0–8.3)

## 2024-05-22 LAB — LIPID PANEL
Cholesterol: 229 mg/dL — ABNORMAL HIGH (ref 28–200)
HDL: 65.5 mg/dL
LDL Cholesterol: 147 mg/dL — ABNORMAL HIGH (ref 10–99)
NonHDL: 163.8
Total CHOL/HDL Ratio: 4
Triglycerides: 85 mg/dL (ref 10.0–149.0)
VLDL: 17 mg/dL (ref 0.0–40.0)

## 2024-05-22 LAB — CBC
HCT: 43.2 % (ref 39.0–52.0)
Hemoglobin: 14.2 g/dL (ref 13.0–17.0)
MCHC: 32.8 g/dL (ref 30.0–36.0)
MCV: 80.4 fl (ref 78.0–100.0)
Platelets: 179 K/uL (ref 150.0–400.0)
RBC: 5.38 Mil/uL (ref 4.22–5.81)
RDW: 13.3 % (ref 11.5–15.5)
WBC: 3.4 K/uL — ABNORMAL LOW (ref 4.0–10.5)

## 2024-05-22 LAB — HEMOGLOBIN A1C: Hgb A1c MFr Bld: 5.9 % (ref 4.6–6.5)

## 2024-05-22 LAB — TSH: TSH: 0.95 u[IU]/mL (ref 0.35–5.50)

## 2024-05-22 MED ORDER — HYDROCHLOROTHIAZIDE 25 MG PO TABS: 25.0000 mg | ORAL_TABLET | Freq: Every day | ORAL | 3 refills | Status: AC

## 2024-05-22 MED ORDER — AMLODIPINE BESYLATE 10 MG PO TABS: 10.0000 mg | ORAL_TABLET | Freq: Every day | ORAL | 3 refills | Status: AC

## 2024-05-22 NOTE — Progress Notes (Signed)
 "  Subjective:  Patient ID: Dakota Bowman, male    DOB: [birth year]  Age: 53 y.o. MRN: [id number]  CC:  Chief Complaint  Patient presents with   Annual Exam    No questions or concerns today. Reports he is doing well    HPI Dakota Bowman presents for Annual Exam No health changes.   PCP, me No other specialist at this time.  Hypertension: Amlodipine  10 mg daily, HCTZ 25 mg daily.  Stable at his June visit.  No new side effects.  Borderline at 130/80 range at his June visit.  Continued same regimen. Home readings:  120/78 range. No side effects. Slight HA at times, rare. Not daily.   BP Readings from Last 3 Encounters:  05/22/24 132/82  10/30/23 120/84  05/10/23 126/74   Lab Results  Component Value Date   CREATININE 1.13 10/30/2023    Prediabetes: Diet/exercise approach.  Weight has increased somewhat since June. Exercising more - when weather is good - 4-5 times per week, recently 2 times per week.  No soda. Honey in tea only. No sweet tea.   Lab Results  Component Value Date   HGBA1C 6.0 10/30/2023   Wt Readings from Last 3 Encounters:  05/22/24 185 lb 3.2 oz (84 kg)  10/30/23 177 lb 9.6 oz (80.6 kg)  05/10/23 179 lb 3.2 oz (81.3 kg)    Hyperlipidemia: Diet/exercise approach previously.  Coronary calcium scoring 11/27/2023, with calcium noted within the LAD, 65th percentile.  Option of low-dose statin.  No current meds.  Declines statin at this time.  Lab Results  Component Value Date   CHOL 213 (H) 10/30/2023   HDL 62.00 10/30/2023   LDLCALC 136 (H) 10/30/2023   TRIG 74.0 10/30/2023   CHOLHDL 3 10/30/2023   Lab Results  Component Value Date   ALT 16 10/30/2023   AST 21 10/30/2023   ALKPHOS 38 (L) 10/30/2023   BILITOT 0.5 10/30/2023       05/10/2023    8:33 AM 11/07/2022    8:32 AM 05/05/2022    8:41 AM 10/21/2021    9:44 AM 04/21/2021    8:12 AM  Depression screen PHQ 2/9  Decreased Interest 0 0 0 0 0  Down, Depressed, Hopeless 0 0 0 0 0  PHQ  - 2 Score 0 0 0 0 0  Altered sleeping 0 0 0 0   Tired, decreased energy 0 0 0 0   Change in appetite 0 0 0 0   Feeling bad or failure about yourself  0 0 0 0   Trouble concentrating 0 0 0 0   Moving slowly or fidgety/restless 0 0 0 0   Suicidal thoughts 0 0 0 0   PHQ-9 Score 0  0  0  0    Difficult doing work/chores    Not difficult at all      Data saved with a previous flowsheet row definition    Health Maintenance  Topic Date Due   COVID-19 Vaccine (1 - 2025-26 season) 06/07/2024 (Originally 01/22/2024)   Zoster Vaccines- Shingrix (1 of 2) 08/20/2024 (Originally 05/24/2019)   Pneumococcal Vaccine: 50+ Years (1 of 1 - PCV) 05/22/2025 (Originally 05/24/2019)   Hepatitis B Vaccines 19-59 Average Risk (1 of 3 - 19+ 3-dose series) 05/22/2025 (Originally 05/23/1988)   Colonoscopy  10/28/2029   Influenza Vaccine  Completed   Hepatitis C Screening  Completed   HIV Screening  Completed   HPV VACCINES  Aged Out   Meningococcal B Vaccine  Aged Out   DTaP/Tdap/Td  Discontinued  Colonoscopy in June 2021 with Dr. Abran, repeat 10 years. Prostate: does not have family history of prostate cancer The natural history of prostate cancer and ongoing controversy regarding screening and potential treatment outcomes of prostate cancer has been discussed with the patient. The meaning of a false positive PSA and a false negative PSA has been discussed. He indicates understanding of the limitations of this screening test and wishes to defer screening PSA testing. Considering next year.  Lab Results  Component Value Date   PSA1 0.3 02/14/2017   PSA 0.35 05/10/2023   PSA 0.30 04/21/2021   PSA 0.31 02/09/2015      Immunization History  Administered Date(s) Administered   Influenza, Seasonal, Injecte, Preservative Fre 05/10/2023, 05/22/2024   Influenza,inj,Quad PF,6+ Mos 02/09/2016, 02/14/2017, 03/03/2018, 03/06/2019, 03/11/2020, 04/21/2021, 05/05/2022   Influenza-Unspecified 03/24/2015   Tdap 10/20/2011,  05/10/2023  Shingles vaccine - declines.  Flu vaccine given today. Declines pna vaccine.   No results found. Optho  appt earlier this year - no issues.   Dental: every 6 months.   Alcohol: none  Tobacco: none  Exercise: as above.    History Patient Active Problem List   Diagnosis Date Noted   Hand dermatitis 05/22/2024   Irritant contact dermatitis 05/22/2024   Essential hypertension 02/09/2015   Hyperlipidemia 02/09/2015   Health care maintenance 10/20/2011   Tinea pedis 10/20/2011   Past Medical History:  Diagnosis Date   Allergy    seasonal   Essential hypertension 02/09/2015   Hyperlipidemia 02/09/2015   Past Surgical History:  Procedure Laterality Date   NASAL SINUS SURGERY  2005   Allergies[1] Prior to Admission medications  Medication Sig Start Date End Date Taking? Authorizing Provider  amLODipine  (NORVASC ) 10 MG tablet Take 1 tablet (10 mg total) by mouth daily. 05/10/23  Yes Levora Reyes SAUNDERS, MD  clobetasol ointment (TEMOVATE) 0.05 %  06/18/19  Yes [provider]  clotrimazole  (LOTRIMIN ) 1 % cream Apply 1 application topically 2 (two) times daily. Between toes. 09/04/19  Yes Levora Reyes SAUNDERS, MD  hydrochlorothiazide  (HYDRODIURIL ) 25 MG tablet Take 1 tablet (25 mg total) by mouth daily. Patient not taking: Reported on 05/22/2024 05/10/23   Levora Reyes SAUNDERS, MD   Social History   Socioeconomic History   Marital status: Married    Spouse name: Not on file   Number of children: Not on file   Years of education: Not on file   Highest education level: Associate degree: occupational, scientist, product/process development, or vocational program  Occupational History   Not on file  Tobacco Use   Smoking status: Never   Smokeless tobacco: Never  Vaping Use   Vaping status: Never Used  Substance and Sexual Activity   Alcohol use: No    Alcohol/week: 0.0 standard drinks of alcohol   Drug use: No   Sexual activity: Yes  Other Topics Concern   Not on file  Social History  Narrative   Not on file   Social Drivers of Health   Tobacco Use: Low Risk (05/22/2024)   Patient History    Smoking Tobacco Use: Never    Smokeless Tobacco Use: Never    Passive Exposure: Not on file  Financial Resource Strain: Low Risk (10/28/2023)   Overall Financial Resource Strain (CARDIA)    Difficulty of Paying Living Expenses: Not very hard  Food Insecurity: No Food Insecurity (10/28/2023)   Hunger Vital Sign    Worried About Running Out of Food in the  Last Year: Never true    Ran Out of Food in the Last Year: Never true  Transportation Needs: No Transportation Needs (10/28/2023)   PRAPARE - Administrator, Civil Service (Medical): No    Lack of Transportation (Non-Medical): No  Physical Activity: Sufficiently Active (10/28/2023)   Exercise Vital Sign    Days of Exercise per Week: 5 days    Minutes of Exercise per Session: 60 min  Stress: No Stress Concern Present (10/28/2023)   [employer] of Occupational Health - Occupational Stress Questionnaire    Feeling of Stress : Not at all  Social Connections: Moderately Integrated (10/28/2023)   Social Connection and Isolation Panel    Frequency of Communication with Friends and Family: More than three times a week    Frequency of Social Gatherings with Friends and Family: Three times a week    Attends Religious Services: More than 4 times per year    Active Member of Clubs or Organizations: No    Attends Banker Meetings: Not on file    Marital Status: Married  Catering Manager Violence: Not on file  Depression (PHQ2-9): Low Risk (05/10/2023)   Depression (PHQ2-9)    PHQ-2 Score: 0  Alcohol Screen: Not on file  Housing: High Risk (10/28/2023)   Housing Stability Vital Sign    Unable to Pay for Housing in the Last Year: Yes    Number of Times Moved in the Last Year: 0    Homeless in the Last Year: No  Utilities: Not on file  Health Literacy: Not on file    Review of Systems  13 point review of  systems per patient health survey noted.  Negative other than as indicated above or in HPI.   Objective:   Vitals:   05/22/24 0935 05/22/24 0944  BP: (!) 134/100 132/82  Pulse: 60   Resp: 16   Temp: 97.8 F (36.6 C)   TempSrc: Temporal   SpO2: 99%   Weight: 185 lb 3.2 oz (84 kg)   Height: 5' 7.75 (1.721 m)      Physical Exam Vitals reviewed.  Constitutional:      Appearance: He is well-developed.  HENT:     Head: Normocephalic and atraumatic.     Right Ear: External ear normal.     Left Ear: External ear normal.  Eyes:     Conjunctiva/sclera: Conjunctivae normal.     Pupils: Pupils are equal, round, and reactive to light.  Neck:     Thyroid : No thyromegaly.  Cardiovascular:     Rate and Rhythm: Normal rate and regular rhythm.     Heart sounds: Normal heart sounds.  Pulmonary:     Effort: Pulmonary effort is normal. No respiratory distress.     Breath sounds: Normal breath sounds. No wheezing.  Abdominal:     General: There is no distension.     Palpations: Abdomen is soft.     Tenderness: There is no abdominal tenderness.  Musculoskeletal:        General: No tenderness. Normal range of motion.     Cervical back: Normal range of motion and neck supple.  Lymphadenopathy:     Cervical: No cervical adenopathy.  Skin:    General: Skin is warm and dry.  Neurological:     Mental Status: He is alert and oriented to person, place, and time.     Deep Tendon Reflexes: Reflexes are normal and symmetric.  Psychiatric:  Behavior: Behavior normal.        Assessment & Plan:  Dakota Bowman is a 53 y.o. male . Annual physical exam  - -anticipatory guidance as below in AVS, screening labs above. Health maintenance items as above in HPI discussed/recommended as applicable.   Need for influenza vaccination - Plan: Flu vaccine trivalent PF, 6mos and older(Flulaval,Afluria,Fluarix,Fluzone)  Screening for deficiency anemia - Plan: CBC  Prediabetes - Plan:  Hemoglobin A1c  - Commended on exercise, check A1c and adjust plan accordingly.  Essential hypertension - Plan: TSH, CBC, amLODipine  (NORVASC ) 10 MG tablet, hydrochlorothiazide  (HYDRODIURIL ) 25 MG tablet  - Borderline control.  Home monitoring recommended with option of additional medication if persistent readings above 130/80.  Slight headache today may be increasing BP.  Infrequent headaches, RTC precautions if daily or worsening headaches, or if those are persistent.  Understanding expressed.  Hyperlipidemia, unspecified hyperlipidemia type - Plan: Comprehensive metabolic panel with GFR, Lipid panel  - With coronary calcium scoring indicating calcium within the LAD.  Statin was recommended, declined at this time.  Check updated labs as above.  Meds ordered this encounter  Medications   amLODipine  (NORVASC ) 10 MG tablet    Sig: Take 1 tablet (10 mg total) by mouth daily.    Dispense:  90 tablet    Refill:  3   hydrochlorothiazide  (HYDRODIURIL ) 25 MG tablet    Sig: Take 1 tablet (25 mg total) by mouth daily.    Dispense:  90 tablet    Refill:  3   Patient Instructions  Keep a record of your blood pressures outside of the office and if running over 130/80 consistently we may want to add a medicine. Let me know.   Flu vaccine given today.  Pneumonia vaccine and shingles vaccines are recommended, let me know if you change your mind on those vaccines.  Thank you for coming in today. No change in medications at this time. If there are any concerns on your bloodwork, I will let you know. Take care!    Keeping you healthy  Get these tests Blood pressure- Have your blood pressure checked once a year by your healthcare provider.  Normal blood pressure is 120/80 Weight- Have your body mass index (BMI) calculated to screen for obesity.  BMI is a measure of body fat based on height and weight. You can also calculate your own BMI at programcam.[email or web address]. Cholesterol- Have your cholesterol  checked every year. Diabetes- Have your blood sugar checked regularly if you have high blood pressure, high cholesterol, have a family history of diabetes or if you are overweight. Screening for Colon Cancer- Colonoscopy starting at age 29.  Screening may begin sooner depending on your family history and other health conditions. Follow up colonoscopy as directed by your Gastroenterologist. Screening for Prostate Cancer- Both blood work (PSA) and a rectal exam help screen for Prostate Cancer.  Screening begins at age 80 with African-American men and at age 71 with Caucasian men.  Screening may begin sooner depending on your family history.  Take these medicines Aspirin- One aspirin daily can help prevent Heart disease and Stroke. Flu shot- Every fall. Tetanus- Every 10 years. Zostavax- Once after the age of 66 to prevent Shingles. Pneumonia shot- Once after the age of 31; if you are younger than 73, ask your healthcare provider if you need a Pneumonia shot.  Take these steps Don't smoke- If you do smoke, talk to your doctor about quitting.  For tips on how to quit, go to  www.smokefree.gov or call 1-800-QUIT-NOW. Be physically active- Exercise 5 days a week for at least 30 minutes.  If you are not already physically active start slow and gradually work up to 30 minutes of moderate physical activity.  Examples of moderate activity include walking briskly, mowing the yard, dancing, swimming, bicycling, etc. Eat a healthy diet- Eat a variety of healthy food such as fruits, vegetables, low fat milk, low fat cheese, yogurt, lean meant, poultry, fish, beans, tofu, etc. For more information go to www.thenutritionsource.org Drink alcohol in moderation- Limit alcohol intake to less than two drinks a day. Never drink and drive. Dentist- Brush and floss twice daily; visit your dentist twice a year. Depression- Your emotional health is as important as your physical health. If you're feeling down, or losing  interest in things you would normally enjoy please talk to your healthcare provider. Eye exam- Visit your eye doctor every year. Safe sex- If you may be exposed to a sexually transmitted infection, use a condom. Seat belts- Seat belts can save your life; always wear one. Smoke/Carbon Monoxide detectors- These detectors need to be installed on the appropriate level of your home.  Replace batteries at least once a year. Skin cancer- When out in the sun, cover up and use sunscreen 15 SPF or higher. Violence- If anyone is threatening you, please tell your healthcare provider. Living Will/ Health care power of attorney- Speak with your healthcare provider and family. Managing Your Hypertension Hypertension, also called high blood pressure, is when the force of the blood pressing against the walls of the arteries is too strong. Arteries are blood vessels that carry blood from your heart throughout your body. Hypertension forces the heart to work harder to pump blood and may cause the arteries to become narrow or stiff. Understanding blood pressure readings A blood pressure reading includes a higher number over a lower number: The first, or top, number is called the systolic pressure. It is a measure of the pressure in your arteries as your heart beats. The second, or bottom number, is called the diastolic pressure. It is a measure of the pressure in your arteries as the heart relaxes. For most people, a normal blood pressure is below 120/80. Your personal target blood pressure may vary depending on your medical conditions, your age, and other factors. Blood pressure is classified into four stages. Based on your blood pressure reading, your health care provider may use the following stages to determine what type of treatment you need, if any. Systolic pressure and diastolic pressure are measured in a unit called millimeters of mercury (mmHg). Normal Systolic pressure: below 120. Diastolic pressure: below  80. Elevated Systolic pressure: 120-129. Diastolic pressure: below 80. Hypertension stage 1 Systolic pressure: 130-139. Diastolic pressure: 80-89. Hypertension stage 2 Systolic pressure: 140 or above. Diastolic pressure: 90 or above. How can this condition affect me? Managing your hypertension is very important. Over time, hypertension can damage the arteries and decrease blood flow to parts of the body, including the brain, heart, and kidneys. Having untreated or uncontrolled hypertension can lead to: A heart attack. A stroke. A weakened blood vessel (aneurysm). Heart failure. Kidney damage. Eye damage. Memory and concentration problems. Vascular dementia. What actions can I take to manage this condition? Hypertension can be managed by making lifestyle changes and possibly by taking medicines. Your health care provider will help you make a plan to bring your blood pressure within a normal range. You may be referred for counseling on a healthy diet and  physical activity. Nutrition  Eat a diet that is high in fiber and potassium, and low in salt (sodium), added sugar, and fat. An example eating plan is called the DASH diet. DASH stands for Dietary Approaches to Stop Hypertension. To eat this way: Eat plenty of fresh fruits and vegetables. Try to fill one-half of your plate at each meal with fruits and vegetables. Eat whole grains, such as whole-wheat pasta, brown rice, or whole-grain bread. Fill about one-fourth of your plate with whole grains. Eat low-fat dairy products. Avoid fatty cuts of meat, processed or cured meats, and poultry with skin. Fill about one-fourth of your plate with lean proteins such as fish, chicken without skin, beans, eggs, and tofu. Avoid pre-made and processed foods. These tend to be higher in sodium, added sugar, and fat. Reduce your daily sodium intake. Many people with hypertension should eat less than 1,500 mg of sodium a day. Lifestyle  Work with your  health care provider to maintain a healthy body weight or to lose weight. Ask what an ideal weight is for you. Get at least 30 minutes of exercise that causes your heart to beat faster (aerobic exercise) most days of the week. Activities may include walking, swimming, or biking. Include exercise to strengthen your muscles (resistance exercise), such as weight lifting, as part of your weekly exercise routine. Try to do these types of exercises for 30 minutes at least 3 days a week. Do not use any products that contain nicotine or tobacco. These products include cigarettes, chewing tobacco, and vaping devices, such as e-cigarettes. If you need help quitting, ask your health care provider. Control any long-term (chronic) conditions you have, such as high cholesterol or diabetes. Identify your sources of stress and find ways to manage stress. This may include meditation, deep breathing, or making time for fun activities. Alcohol use Do not drink alcohol if: Your health care provider tells you not to drink. You are pregnant, may be pregnant, or are planning to become pregnant. If you drink alcohol: Limit how much you have to: 0-1 drink a day for women. 0-2 drinks a day for men. Know how much alcohol is in your drink. In the [place] one drink equals one 12 oz bottle of beer (355 mL), one 5 oz glass of wine (148 mL), or one 1 oz glass of hard liquor (44 mL). Medicines Your health care provider may prescribe medicine if lifestyle changes are not enough to get your blood pressure under control and if: Your systolic blood pressure is 130 or higher. Your diastolic blood pressure is 80 or higher. Take medicines only as told by your health care provider. Follow the directions carefully. Blood pressure medicines must be taken as told by your health care provider. The medicine does not work as well when you skip doses. Skipping doses also puts you at risk for problems. Monitoring Before you monitor your blood  pressure: Do not smoke, drink caffeinated beverages, or exercise within 30 minutes before taking a measurement. Use the bathroom and empty your bladder (urinate). Sit quietly for at least 5 minutes before taking measurements. Monitor your blood pressure at home as told by your health care provider. To do this: Sit with your back straight and supported. Place your feet flat on the floor. Do not cross your legs. Support your arm on a flat surface, such as a table. Make sure your upper arm is at heart level. Each time you measure, take two or three readings one minute apart and  record the results. You may also need to have your blood pressure checked regularly by your health care provider. General information Talk with your health care provider about your diet, exercise habits, and other lifestyle factors that may be contributing to hypertension. Review all the medicines you take with your health care provider because there may be side effects or interactions. Keep all follow-up visits. Your health care provider can help you create and adjust your plan for managing your high blood pressure. Where to find more information National Heart, Lung, and Blood Institute: popsteam.is American Heart Association: www.heart.org Contact a health care provider if: You think you are having a reaction to medicines you have taken. You have repeated (recurrent) headaches. You feel dizzy. You have swelling in your ankles. You have trouble with your vision. Get help right away if: You develop a severe headache or confusion. You have unusual weakness or numbness, or you feel faint. You have severe pain in your chest or abdomen. You vomit repeatedly. You have trouble breathing. These symptoms may be an emergency. Get help right away. Call 911. Do not wait to see if the symptoms will go away. Do not drive yourself to the hospital. Summary Hypertension is when the force of blood pumping through your  arteries is too strong. If this condition is not controlled, it may put you at risk for serious complications. Your personal target blood pressure may vary depending on your medical conditions, your age, and other factors. For most people, a normal blood pressure is less than 120/80. Hypertension is managed by lifestyle changes, medicines, or both. Lifestyle changes to help manage hypertension include losing weight, eating a healthy, low-sodium diet, exercising more, stopping smoking, and limiting alcohol. This information is not intended to replace advice given to you by your health care provider. Make sure you discuss any questions you have with your health care provider. Document Revised: 01/21/2021 Document Reviewed: 01/21/2021 Elsevier Patient Education  2024 [employer].    Signed,   Reyes Pines, MD [facility], [facility] 05/22/2024 10:17 AM      [1]  Allergies Allergen Reactions   Latex    "

## 2024-05-22 NOTE — Patient Instructions (Addendum)
 Keep a record of your blood pressures outside of the office and if running over 130/80 consistently we may want to add a medicine. Let me know.   Flu vaccine given today.  Pneumonia vaccine and shingles vaccines are recommended, let me know if you change your mind on those vaccines.  Thank you for coming in today. No change in medications at this time. If there are any concerns on your bloodwork, I will let you know. Take care!    Keeping you healthy  Get these tests Blood pressure- Have your blood pressure checked once a year by your healthcare provider.  Normal blood pressure is 120/80 Weight- Have your body mass index (BMI) calculated to screen for obesity.  BMI is a measure of body fat based on height and weight. You can also calculate your own BMI at [email or web address]. Cholesterol- Have your cholesterol checked every year. Diabetes- Have your blood sugar checked regularly if you have high blood pressure, high cholesterol, have a family history of diabetes or if you are overweight. Screening for Colon Cancer- Colonoscopy starting at age 25.  Screening may begin sooner depending on your family history and other health conditions. Follow up colonoscopy as directed by your Gastroenterologist. Screening for Prostate Cancer- Both blood work (PSA) and a rectal exam help screen for Prostate Cancer.  Screening begins at age 89 with African-American men and at age 32 with Caucasian men.  Screening may begin sooner depending on your family history.  Take these medicines Aspirin- One aspirin daily can help prevent Heart disease and Stroke. Flu shot- Every fall. Tetanus- Every 10 years. Zostavax- Once after the age of 73 to prevent Shingles. Pneumonia shot- Once after the age of 21; if you are younger than 70, ask your healthcare provider if you need a Pneumonia shot.  Take these steps Don't smoke- If you do smoke, talk to your doctor about quitting.  For tips on how to quit, go to  www.smokefree.gov or call 1-800-QUIT-NOW. Be physically active- Exercise 5 days a week for at least 30 minutes.  If you are not already physically active start slow and gradually work up to 30 minutes of moderate physical activity.  Examples of moderate activity include walking briskly, mowing the yard, dancing, swimming, bicycling, etc. Eat a healthy diet- Eat a variety of healthy food such as fruits, vegetables, low fat milk, low fat cheese, yogurt, lean meant, poultry, fish, beans, tofu, etc. For more information go to www.thenutritionsource.org Drink alcohol in moderation- Limit alcohol intake to less than two drinks a day. Never drink and drive. Dentist- Brush and floss twice daily; visit your dentist twice a year. Depression- Your emotional health is as important as your physical health. If you're feeling down, or losing interest in things you would normally enjoy please talk to your healthcare provider. Eye exam- Visit your eye doctor every year. Safe sex- If you may be exposed to a sexually transmitted infection, use a condom. Seat belts- Seat belts can save your life; always wear one. Smoke/Carbon Monoxide detectors- These detectors need to be installed on the appropriate level of your home.  Replace batteries at least once a year. Skin cancer- When out in the sun, cover up and use sunscreen 15 SPF or higher. Violence- If anyone is threatening you, please tell your healthcare provider. Living Will/ Health care power of attorney- Speak with your healthcare provider and family. Managing Your Hypertension Hypertension, also called high blood pressure, is when the force of the blood pressing against the  walls of the arteries is too strong. Arteries are blood vessels that carry blood from your heart throughout your body. Hypertension forces the heart to work harder to pump blood and may cause the arteries to become narrow or stiff. Understanding blood pressure readings A blood pressure reading  includes a higher number over a lower number: The first, or top, number is called the systolic pressure. It is a measure of the pressure in your arteries as your heart beats. The second, or bottom number, is called the diastolic pressure. It is a measure of the pressure in your arteries as the heart relaxes. For most people, a normal blood pressure is below 120/80. Your personal target blood pressure may vary depending on your medical conditions, your age, and other factors. Blood pressure is classified into four stages. Based on your blood pressure reading, your health care provider may use the following stages to determine what type of treatment you need, if any. Systolic pressure and diastolic pressure are measured in a unit called millimeters of mercury (mmHg). Normal Systolic pressure: below 120. Diastolic pressure: below 80. Elevated Systolic pressure: 120-129. Diastolic pressure: below 80. Hypertension stage 1 Systolic pressure: 130-139. Diastolic pressure: 80-89. Hypertension stage 2 Systolic pressure: 140 or above. Diastolic pressure: 90 or above. How can this condition affect me? Managing your hypertension is very important. Over time, hypertension can damage the arteries and decrease blood flow to parts of the body, including the brain, heart, and kidneys. Having untreated or uncontrolled hypertension can lead to: A heart attack. A stroke. A weakened blood vessel (aneurysm). Heart failure. Kidney damage. Eye damage. Memory and concentration problems. Vascular dementia. What actions can I take to manage this condition? Hypertension can be managed by making lifestyle changes and possibly by taking medicines. Your health care provider will help you make a plan to bring your blood pressure within a normal range. You may be referred for counseling on a healthy diet and physical activity. Nutrition  Eat a diet that is high in fiber and potassium, and low in salt (sodium), added  sugar, and fat. An example eating plan is called the DASH diet. DASH stands for Dietary Approaches to Stop Hypertension. To eat this way: Eat plenty of fresh fruits and vegetables. Try to fill one-half of your plate at each meal with fruits and vegetables. Eat whole grains, such as whole-wheat pasta, brown rice, or whole-grain bread. Fill about one-fourth of your plate with whole grains. Eat low-fat dairy products. Avoid fatty cuts of meat, processed or cured meats, and poultry with skin. Fill about one-fourth of your plate with lean proteins such as fish, chicken without skin, beans, eggs, and tofu. Avoid pre-made and processed foods. These tend to be higher in sodium, added sugar, and fat. Reduce your daily sodium intake. Many people with hypertension should eat less than 1,500 mg of sodium a day. Lifestyle  Work with your health care provider to maintain a healthy body weight or to lose weight. Ask what an ideal weight is for you. Get at least 30 minutes of exercise that causes your heart to beat faster (aerobic exercise) most days of the week. Activities may include walking, swimming, or biking. Include exercise to strengthen your muscles (resistance exercise), such as weight lifting, as part of your weekly exercise routine. Try to do these types of exercises for 30 minutes at least 3 days a week. Do not use any products that contain nicotine or tobacco. These products include cigarettes, chewing tobacco, and vaping devices,  such as e-cigarettes. If you need help quitting, ask your health care provider. Control any long-term (chronic) conditions you have, such as high cholesterol or diabetes. Identify your sources of stress and find ways to manage stress. This may include meditation, deep breathing, or making time for fun activities. Alcohol use Do not drink alcohol if: Your health care provider tells you not to drink. You are pregnant, may be pregnant, or are planning to become pregnant. If  you drink alcohol: Limit how much you have to: 0-1 drink a day for women. 0-2 drinks a day for men. Know how much alcohol is in your drink. In the [place] one drink equals one 12 oz bottle of beer (355 mL), one 5 oz glass of wine (148 mL), or one 1 oz glass of hard liquor (44 mL). Medicines Your health care provider may prescribe medicine if lifestyle changes are not enough to get your blood pressure under control and if: Your systolic blood pressure is 130 or higher. Your diastolic blood pressure is 80 or higher. Take medicines only as told by your health care provider. Follow the directions carefully. Blood pressure medicines must be taken as told by your health care provider. The medicine does not work as well when you skip doses. Skipping doses also puts you at risk for problems. Monitoring Before you monitor your blood pressure: Do not smoke, drink caffeinated beverages, or exercise within 30 minutes before taking a measurement. Use the bathroom and empty your bladder (urinate). Sit quietly for at least 5 minutes before taking measurements. Monitor your blood pressure at home as told by your health care provider. To do this: Sit with your back straight and supported. Place your feet flat on the floor. Do not cross your legs. Support your arm on a flat surface, such as a table. Make sure your upper arm is at heart level. Each time you measure, take two or three readings one minute apart and record the results. You may also need to have your blood pressure checked regularly by your health care provider. General information Talk with your health care provider about your diet, exercise habits, and other lifestyle factors that may be contributing to hypertension. Review all the medicines you take with your health care provider because there may be side effects or interactions. Keep all follow-up visits. Your health care provider can help you create and adjust your plan for managing your high  blood pressure. Where to find more information National Heart, Lung, and Blood Institute: popsteam.is American Heart Association: www.heart.org Contact a health care provider if: You think you are having a reaction to medicines you have taken. You have repeated (recurrent) headaches. You feel dizzy. You have swelling in your ankles. You have trouble with your vision. Get help right away if: You develop a severe headache or confusion. You have unusual weakness or numbness, or you feel faint. You have severe pain in your chest or abdomen. You vomit repeatedly. You have trouble breathing. These symptoms may be an emergency. Get help right away. Call 911. Do not wait to see if the symptoms will go away. Do not drive yourself to the hospital. Summary Hypertension is when the force of blood pumping through your arteries is too strong. If this condition is not controlled, it may put you at risk for serious complications. Your personal target blood pressure may vary depending on your medical conditions, your age, and other factors. For most people, a normal blood pressure is less than 120/80. Hypertension is  managed by lifestyle changes, medicines, or both. Lifestyle changes to help manage hypertension include losing weight, eating a healthy, low-sodium diet, exercising more, stopping smoking, and limiting alcohol. This information is not intended to replace advice given to you by your health care provider. Make sure you discuss any questions you have with your health care provider. Document Revised: 01/21/2021 Document Reviewed: 01/21/2021 Elsevier Patient Education  2024 [employer].

## 2024-05-25 ENCOUNTER — Ambulatory Visit: Payer: Self-pay | Admitting: Family Medicine

## 2024-11-27 ENCOUNTER — Ambulatory Visit: Admitting: Family Medicine
